# Patient Record
Sex: Female | Born: 1985 | Race: Black or African American | Hispanic: No | Marital: Single | Smoking: Never smoker
Health system: Southern US, Community
[De-identification: ages and names within clinical notes are randomized; demographics above are authoritative.]

## PROBLEM LIST (undated history)

## (undated) DIAGNOSIS — I10 Essential (primary) hypertension: Secondary | ICD-10-CM

---

## 2009-12-20 ENCOUNTER — Emergency Department (HOSPITAL_BASED_OUTPATIENT_CLINIC_OR_DEPARTMENT_OTHER): Admission: EM | Admit: 2009-12-20 | Discharge: 2009-12-20 | Payer: Self-pay | Admitting: Emergency Medicine

## 2010-10-11 ENCOUNTER — Emergency Department (HOSPITAL_BASED_OUTPATIENT_CLINIC_OR_DEPARTMENT_OTHER): Admission: EM | Admit: 2010-10-11 | Discharge: 2010-10-11 | Payer: Self-pay | Admitting: Emergency Medicine

## 2011-02-06 LAB — URINALYSIS, ROUTINE W REFLEX MICROSCOPIC
Bilirubin Urine: NEGATIVE
Glucose, UA: NEGATIVE mg/dL
Ketones, ur: NEGATIVE mg/dL
Specific Gravity, Urine: 1.026 (ref 1.005–1.030)
pH: 7.5 (ref 5.0–8.0)

## 2011-02-06 LAB — WET PREP, GENITAL: Trich, Wet Prep: NONE SEEN

## 2011-02-06 LAB — HCG, QUANTITATIVE, PREGNANCY: hCG, Beta Chain, Quant, S: 2656 m[IU]/mL — ABNORMAL HIGH (ref <5)

## 2011-02-06 LAB — DIFFERENTIAL
Basophils Absolute: 0.1 10*3/uL (ref 0.0–0.1)
Eosinophils Absolute: 0.1 10*3/uL (ref 0.0–0.7)
Eosinophils Relative: 1 % (ref 0–5)
Lymphs Abs: 2.1 10*3/uL (ref 0.7–4.0)
Neutrophils Relative %: 68 % (ref 43–77)

## 2011-02-06 LAB — ABO/RH: ABO/RH(D): O POS

## 2011-02-06 LAB — CBC
HCT: 41.3 % (ref 36.0–46.0)
MCV: 86.9 fL (ref 78.0–100.0)
Platelets: 242 10*3/uL (ref 150–400)
RDW: 13.7 % (ref 11.5–15.5)
WBC: 8.3 10*3/uL (ref 4.0–10.5)

## 2011-02-06 LAB — GC/CHLAMYDIA PROBE AMP, GENITAL: Chlamydia, DNA Probe: NEGATIVE

## 2011-02-28 ENCOUNTER — Emergency Department (HOSPITAL_BASED_OUTPATIENT_CLINIC_OR_DEPARTMENT_OTHER)
Admission: EM | Admit: 2011-02-28 | Discharge: 2011-02-28 | Disposition: A | Discharge status: Home / Self Care | Payer: Self-pay | Attending: Emergency Medicine | Admitting: Emergency Medicine

## 2011-02-28 DIAGNOSIS — J329 Chronic sinusitis, unspecified: Secondary | ICD-10-CM | POA: Insufficient documentation

## 2011-02-28 LAB — URINALYSIS, ROUTINE W REFLEX MICROSCOPIC
Bilirubin Urine: NEGATIVE
Glucose, UA: NEGATIVE mg/dL
Ketones, ur: NEGATIVE mg/dL
Protein, ur: NEGATIVE mg/dL
Urobilinogen, UA: 0.2 mg/dL (ref 0.0–1.0)

## 2011-02-28 LAB — URINE MICROSCOPIC-ADD ON

## 2012-02-20 ENCOUNTER — Encounter (HOSPITAL_BASED_OUTPATIENT_CLINIC_OR_DEPARTMENT_OTHER): Payer: Self-pay | Admitting: *Deleted

## 2012-02-20 ENCOUNTER — Emergency Department (HOSPITAL_BASED_OUTPATIENT_CLINIC_OR_DEPARTMENT_OTHER)
Admission: EM | Admit: 2012-02-20 | Discharge: 2012-02-20 | Disposition: A | Discharge status: Home / Self Care | Payer: Self-pay | Attending: Emergency Medicine | Admitting: Emergency Medicine

## 2012-02-20 DIAGNOSIS — B349 Viral infection, unspecified: Secondary | ICD-10-CM

## 2012-02-20 DIAGNOSIS — I10 Essential (primary) hypertension: Secondary | ICD-10-CM | POA: Insufficient documentation

## 2012-02-20 DIAGNOSIS — R1110 Vomiting, unspecified: Secondary | ICD-10-CM | POA: Insufficient documentation

## 2012-02-20 DIAGNOSIS — B9789 Other viral agents as the cause of diseases classified elsewhere: Secondary | ICD-10-CM | POA: Insufficient documentation

## 2012-02-20 DIAGNOSIS — R109 Unspecified abdominal pain: Secondary | ICD-10-CM | POA: Insufficient documentation

## 2012-02-20 DIAGNOSIS — R509 Fever, unspecified: Secondary | ICD-10-CM | POA: Insufficient documentation

## 2012-02-20 HISTORY — DX: Essential (primary) hypertension: I10

## 2012-02-20 LAB — URINALYSIS, ROUTINE W REFLEX MICROSCOPIC
Leukocytes, UA: NEGATIVE
Nitrite: NEGATIVE
Specific Gravity, Urine: 1.035 — ABNORMAL HIGH (ref 1.005–1.030)
Urobilinogen, UA: 1 mg/dL (ref 0.0–1.0)
pH: 6 (ref 5.0–8.0)

## 2012-02-20 LAB — URINE MICROSCOPIC-ADD ON

## 2012-02-20 LAB — PREGNANCY, URINE: Preg Test, Ur: NEGATIVE

## 2012-02-20 LAB — RAPID STREP SCREEN (MED CTR MEBANE ONLY): Streptococcus, Group A Screen (Direct): NEGATIVE

## 2012-02-20 MED ORDER — PROMETHAZINE HCL 25 MG PO TABS: 25.0000 mg | ORAL_TABLET | Freq: Four times a day (QID) | ORAL | Status: DC | PRN

## 2012-02-20 MED ORDER — ONDANSETRON 8 MG PO TBDP
8.0000 mg | ORAL_TABLET | Freq: Once | ORAL | Status: AC
  Administered 2012-02-20: 8 mg via ORAL
  Filled 2012-02-20: qty 1

## 2012-02-20 MED ORDER — HYOSCYAMINE SULFATE 0.125 MG PO TABS
0.2500 mg | ORAL_TABLET | Freq: Once | ORAL | Status: AC
  Administered 2012-02-20: 0.25 mg via ORAL
  Filled 2012-02-20 (×2): qty 1

## 2012-02-20 MED ORDER — HYDROCODONE-ACETAMINOPHEN 5-500 MG PO TABS: 1.0000 | ORAL_TABLET | Freq: Four times a day (QID) | ORAL | Status: AC | PRN

## 2012-02-20 MED ORDER — OXYCODONE-ACETAMINOPHEN 5-325 MG PO TABS
2.0000 | ORAL_TABLET | Freq: Once | ORAL | Status: AC
  Administered 2012-02-20: 2 via ORAL
  Filled 2012-02-20: qty 2

## 2012-02-20 NOTE — Discharge Instructions (Signed)
Viral Gastroenteritis Viral gastroenteritis is also known as stomach flu. This condition affects the stomach and intestinal tract. It can cause sudden diarrhea and vomiting. The illness typically lasts 3 to 8 days. Most people develop an immune response that eventually gets rid of the virus. While this natural response develops, the virus can make you quite ill. CAUSES  Many different viruses can cause gastroenteritis, such as rotavirus or noroviruses. You can catch one of these viruses by consuming contaminated food or water. You may also catch a virus by sharing utensils or other personal items with an infected person or by touching a contaminated surface. SYMPTOMS  The most common symptoms are diarrhea and vomiting. These problems can cause a severe loss of body fluids (dehydration) and a body salt (electrolyte) imbalance. Other symptoms may include:  Fever.   Headache.   Fatigue.   Abdominal pain.  DIAGNOSIS  Your caregiver can usually diagnose viral gastroenteritis based on your symptoms and a physical exam. A stool sample may also be taken to test for the presence of viruses or other infections. TREATMENT  This illness typically goes away on its own. Treatments are aimed at rehydration. The most serious cases of viral gastroenteritis involve vomiting so severely that you are not able to keep fluids down. In these cases, fluids must be given through an intravenous line (IV). HOME CARE INSTRUCTIONS   Drink enough fluids to keep your urine clear or pale yellow. Drink small amounts of fluids frequently and increase the amounts as tolerated.   Ask your caregiver for specific rehydration instructions.   Avoid:   Foods high in sugar.   Alcohol.   Carbonated drinks.   Tobacco.   Juice.   Caffeine drinks.   Extremely hot or cold fluids.   Fatty, greasy foods.   Too much intake of anything at one time.   Dairy products until 24 to 48 hours after diarrhea stops.   You may  consume probiotics. Probiotics are active cultures of beneficial bacteria. They may lessen the amount and number of diarrheal stools in adults. Probiotics can be found in yogurt with active cultures and in supplements.   Wash your hands well to avoid spreading the virus.   Only take over-the-counter or prescription medicines for pain, discomfort, or fever as directed by your caregiver. Do not give aspirin to children. Antidiarrheal medicines are not recommended.   Ask your caregiver if you should continue to take your regular prescribed and over-the-counter medicines.   Keep all follow-up appointments as directed by your caregiver.  SEEK IMMEDIATE MEDICAL CARE IF:   You are unable to keep fluids down.   You do not urinate at least once every 6 to 8 hours.   You develop shortness of breath.   You notice blood in your stool or vomit. This may look like coffee grounds.   You have abdominal pain that increases or is concentrated in one small area (localized).   You have persistent vomiting or diarrhea.   You have a fever.   The patient is a child younger than 3 months, and he or she has a fever.   The patient is a child older than 3 months, and he or she has a fever and persistent symptoms.   The patient is a child older than 3 months, and he or she has a fever and symptoms suddenly get worse.   The patient is a baby, and he or she has no tears when crying.  MAKE SURE YOU:     Understand these instructions.   Will watch your condition.   Will get help right away if you are not doing well or get worse.  Document Released: 11/03/2005 Document Revised: 10/23/2011 Document Reviewed: 08/20/2011 [employer] Patient Information 2012 [employer]. 

## 2012-02-20 NOTE — ED Notes (Signed)
Attempted to discharge pt. Pt sts she has to call her ride. Pt given telephone to do so. Pt advised to let nurse know when driver arrives to pick her up due to pt having pain medication.

## 2012-02-20 NOTE — ED Provider Notes (Signed)
History     CSN: [id number]  Arrival date & time 02/20/12  0804   First MD Initiated Contact with Patient 02/20/12 [phone]      Chief Complaint  Patient presents with  . Emesis  . Fever    (Consider location/radiation/quality/duration/timing/severity/associated sxs/prior treatment) Patient is a 26 y.o. female presenting with vomiting. The history is provided by the patient. No language interpreter was used.  Emesis  This is a new problem. The current episode started 2 days ago. The problem occurs 2 to 4 times per day. The problem has been gradually worsening. The emesis has an appearance of stomach contents. The maximum temperature recorded prior to her arrival was 100 to 100.9 F. The fever has been present for 1 to 2 days. Associated symptoms include abdominal pain and chills. Pertinent negatives include no arthralgias, no cough, no fever, no headaches and no myalgias. Risk factors include ill contacts.    Past Medical History  Diagnosis Date  . Hypertension     History reviewed. No pertinent past surgical history.  No family history on file.  History  Substance Use Topics  . Smoking status: Never Smoker   . Smokeless tobacco: Not on file  . Alcohol Use: Yes     occassionally    OB History    Grav Para Term Preterm Abortions TAB SAB Ect Mult Living                  Review of Systems  Constitutional: Positive for chills. Negative for fever, activity change, appetite change and fatigue.  HENT: Positive for congestion, sore throat and rhinorrhea. Negative for neck pain and neck stiffness.   Respiratory: Negative for cough and shortness of breath.   Cardiovascular: Negative for chest pain and palpitations.  Gastrointestinal: Positive for nausea, vomiting and abdominal pain.  Genitourinary: Negative for dysuria, urgency, frequency and flank pain.  Musculoskeletal: Negative for myalgias, back pain and arthralgias.  Neurological: Negative for dizziness, weakness,  light-headedness, numbness and headaches.  All other systems reviewed and are negative.    Allergies  Review of patient's allergies indicates no known allergies.  Home Medications   Current Outpatient Rx  Name Route Sig Dispense Refill  . NORGESTREL-ETHINYL ESTRADIOL 0.3-30 MG-MCG PO TABS Oral Take 1 tablet by mouth daily.    Marland Kitchen HYDROCODONE-ACETAMINOPHEN 5-500 MG PO TABS Oral Take 1-2 tablets by mouth every 6 (six) hours as needed for pain. 10 tablet 0  . PROMETHAZINE HCL 25 MG PO TABS Oral Take 1 tablet (25 mg total) by mouth every 6 (six) hours as needed for nausea. 20 tablet 0    BP 123/95  Pulse 97  Temp(Src) 98.2 F (36.8 C) (Oral)  Resp 20  Ht 5\' 1"  (1.549 m)  Wt 145 lb (65.772 kg)  BMI 27.40 kg/m2  SpO2 100%  LMP 02/10/2012  Physical Exam  Nursing note and vitals reviewed. Constitutional: She is oriented to person, place, and time. She appears well-developed and well-nourished. No distress.  HENT:  Head: Normocephalic and atraumatic.  Mouth/Throat: Oropharyngeal exudate present.       Erythematous oropharynx  Eyes: Conjunctivae and EOM are normal. Pupils are equal, round, and reactive to light.  Neck: Normal range of motion. Neck supple.  Cardiovascular: Normal rate, normal heart sounds and intact distal pulses.  Exam reveals no gallop and no friction rub.   No murmur heard. Pulmonary/Chest: Effort normal and breath sounds normal. No respiratory distress.  Abdominal: Soft. Bowel sounds are normal. There is tenderness (diffuse).  There is no rebound and no guarding.  Musculoskeletal: Normal range of motion. She exhibits no tenderness.  Neurological: She is alert and oriented to person, place, and time. No cranial nerve deficit.  Skin: Skin is warm and dry. No rash noted.    ED Course  Procedures (including critical care time)  Labs Reviewed  URINALYSIS, ROUTINE W REFLEX MICROSCOPIC - Abnormal; Notable for the following:    Color, Urine AMBER (*) BIOCHEMICALS MAY  BE AFFECTED BY COLOR   Specific Gravity, Urine 1.035 (*)    Bilirubin Urine SMALL (*)    Ketones, ur 15 (*)    Protein, ur 30 (*)    All other components within normal limits  URINE MICROSCOPIC-ADD ON - Abnormal; Notable for the following:    Bacteria, UA MANY (*)    All other components within normal limits  PREGNANCY, URINE  RAPID STREP SCREEN   No results found.   1. Viral syndrome       MDM  viral syndrome with URI and gastritis type symptoms. I encouraged or over-the-counter medications for the viral URI portion of her syndrome. I provided Zofran and pain medication numerous department. Urinalysis and rapid strep negative. We prescribed Phenergan Vicodin for home use. Instructed to followup with her primary care physician and encouraged aggressive oral hydration        Dayton Bailiff, MD 02/20/12 [phone]

## 2012-02-20 NOTE — ED Notes (Signed)
Patient states she developed a fever, sore throat, sinus congestion and bodyaches 2 days ago.  Yesterday she developed nausea and vomting.  Fever has continued with vomiting and abdominal cramping.

## 2012-09-09 ENCOUNTER — Emergency Department (HOSPITAL_BASED_OUTPATIENT_CLINIC_OR_DEPARTMENT_OTHER)
Admission: EM | Admit: 2012-09-09 | Discharge: 2012-09-09 | Disposition: A | Discharge status: Home / Self Care | Payer: Self-pay | Attending: Emergency Medicine | Admitting: Emergency Medicine

## 2012-09-09 ENCOUNTER — Emergency Department (HOSPITAL_BASED_OUTPATIENT_CLINIC_OR_DEPARTMENT_OTHER): Payer: Self-pay

## 2012-09-09 ENCOUNTER — Encounter (HOSPITAL_BASED_OUTPATIENT_CLINIC_OR_DEPARTMENT_OTHER): Payer: Self-pay | Admitting: *Deleted

## 2012-09-09 DIAGNOSIS — S060X9A Concussion with loss of consciousness of unspecified duration, initial encounter: Secondary | ICD-10-CM

## 2012-09-09 DIAGNOSIS — R42 Dizziness and giddiness: Secondary | ICD-10-CM | POA: Insufficient documentation

## 2012-09-09 DIAGNOSIS — R51 Headache: Secondary | ICD-10-CM | POA: Insufficient documentation

## 2012-09-09 DIAGNOSIS — I10 Essential (primary) hypertension: Secondary | ICD-10-CM | POA: Insufficient documentation

## 2012-09-09 DIAGNOSIS — S058X9A Other injuries of unspecified eye and orbit, initial encounter: Secondary | ICD-10-CM

## 2012-09-09 DIAGNOSIS — S060X0A Concussion without loss of consciousness, initial encounter: Secondary | ICD-10-CM | POA: Insufficient documentation

## 2012-09-09 DIAGNOSIS — Z79899 Other long term (current) drug therapy: Secondary | ICD-10-CM | POA: Insufficient documentation

## 2012-09-09 DIAGNOSIS — S0500XA Injury of conjunctiva and corneal abrasion without foreign body, unspecified eye, initial encounter: Secondary | ICD-10-CM | POA: Insufficient documentation

## 2012-09-09 LAB — URINALYSIS, ROUTINE W REFLEX MICROSCOPIC
Glucose, UA: NEGATIVE mg/dL
Ketones, ur: >80 mg/dL — AB
Nitrite: NEGATIVE
Specific Gravity, Urine: 1.034 — ABNORMAL HIGH (ref 1.005–1.030)
pH: 6 (ref 5.0–8.0)

## 2012-09-09 LAB — URINE MICROSCOPIC-ADD ON
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
## 2012-09-10 LAB — URINE CULTURE
Colony Count: NO GROWTH
Culture: NO GROWTH

## 2013-01-19 ENCOUNTER — Emergency Department (HOSPITAL_BASED_OUTPATIENT_CLINIC_OR_DEPARTMENT_OTHER): Payer: Self-pay

## 2013-01-19 ENCOUNTER — Encounter (HOSPITAL_BASED_OUTPATIENT_CLINIC_OR_DEPARTMENT_OTHER): Payer: Self-pay | Admitting: *Deleted

## 2013-01-19 ENCOUNTER — Emergency Department (HOSPITAL_BASED_OUTPATIENT_CLINIC_OR_DEPARTMENT_OTHER)
Admission: EM | Admit: 2013-01-19 | Discharge: 2013-01-19 | Disposition: A | Discharge status: Home / Self Care | Payer: Self-pay | Attending: Emergency Medicine | Admitting: Emergency Medicine

## 2013-01-19 DIAGNOSIS — O210 Mild hyperemesis gravidarum: Secondary | ICD-10-CM | POA: Insufficient documentation

## 2013-01-19 DIAGNOSIS — O209 Hemorrhage in early pregnancy, unspecified: Secondary | ICD-10-CM | POA: Insufficient documentation

## 2013-01-19 DIAGNOSIS — N949 Unspecified condition associated with female genital organs and menstrual cycle: Secondary | ICD-10-CM | POA: Insufficient documentation

## 2013-01-19 DIAGNOSIS — O4591 Premature separation of placenta, unspecified, first trimester: Secondary | ICD-10-CM

## 2013-01-19 DIAGNOSIS — R109 Unspecified abdominal pain: Secondary | ICD-10-CM | POA: Insufficient documentation

## 2013-01-19 DIAGNOSIS — O169 Unspecified maternal hypertension, unspecified trimester: Secondary | ICD-10-CM | POA: Insufficient documentation

## 2013-01-19 DIAGNOSIS — O9989 Other specified diseases and conditions complicating pregnancy, childbirth and the puerperium: Secondary | ICD-10-CM | POA: Insufficient documentation

## 2013-01-19 DIAGNOSIS — O368990 Maternal care for other specified fetal problems, unspecified trimester, not applicable or unspecified: Secondary | ICD-10-CM | POA: Insufficient documentation

## 2013-01-19 LAB — WET PREP, GENITAL
Trich, Wet Prep: NONE SEEN
WBC, Wet Prep HPF POC: NONE SEEN
Yeast Wet Prep HPF POC: NONE SEEN

## 2013-01-19 LAB — ABO/RH: ABO/RH(D): O POS

## 2013-01-19 LAB — HCG, QUANTITATIVE, PREGNANCY: hCG, Beta Chain, Quant, S: 52896 m[IU]/mL — ABNORMAL HIGH (ref <5)

## 2013-01-19 NOTE — ED Provider Notes (Signed)
History     CSN: [id number]  Arrival date & time 01/19/13  1417   First MD Initiated Contact with Patient 01/19/13 1504      Chief Complaint  Patient presents with  . Vaginal Bleeding    (Consider location/radiation/quality/duration/timing/severity/associated sxs/prior treatment) Patient is a 27 y.o. female presenting with vaginal bleeding. The history is provided by the patient. No language interpreter was used.  Vaginal Bleeding This is a new (Last seen by PCP 01/04/13 to take pregnancy test.  Was told she was almost 50mo along.) problem. The current episode started today (Denies trauma, recent illness or increased stress or changes in her life.). Associated symptoms include abdominal pain, nausea and vomiting. Pertinent negatives include no chest pain, chills, fatigue, fever, headaches or urinary symptoms. Associated symptoms comments: Some abdominal cramping. Pt has been nauseous since she found out she was pregnant.  Able to keep down fluids and crackers.. Nothing aggravates the symptoms. She has tried nothing for the symptoms.    Past Medical History  Diagnosis Date  . Hypertension     History reviewed. No pertinent past surgical history.  No family history on file.  History  Substance Use Topics  . Smoking status: Never Smoker   . Smokeless tobacco: Never Used  . Alcohol Use: Yes     Comment: occassionally    OB History   Grav Para Term Preterm Abortions TAB SAB Ect Mult Living                  Review of Systems  Constitutional: Negative for fever, chills and fatigue.  Respiratory: Negative for shortness of breath.   Cardiovascular: Negative for chest pain.  Gastrointestinal: Positive for nausea, vomiting and abdominal pain.  Genitourinary: Positive for vaginal bleeding and pelvic pain. Negative for dysuria and flank pain.  Neurological: Negative for headaches.  Psychiatric/Behavioral: Negative.   All other systems reviewed and are negative.    Allergies   Review of patient's allergies indicates no known allergies.  Home Medications   Current Outpatient Rx  Name  Route  Sig  Dispense  Refill  . Prenatal Vit-Fe Fumarate-FA (PRENATAL MULTIVITAMIN) TABS   Oral   Take 1 tablet by mouth daily at 12 noon.         . norgestrel-ethinyl estradiol (LO/OVRAL,CRYSELLE) 0.3-30 MG-MCG tablet   Oral   Take 1 tablet by mouth daily.         Marland Kitchen EXPIRED: promethazine (PHENERGAN) 25 MG tablet   Oral   Take 1 tablet (25 mg total) by mouth every 6 (six) hours as needed for nausea.   20 tablet   0     BP 124/75  Pulse 110  Temp(Src) 99 F (37.2 C) (Oral)  Resp 20  Ht 5' (1.524 m)  Wt 112 lb (50.803 kg)  BMI 21.87 kg/m2  SpO2 100%  LMP 11/15/2012  Physical Exam  Nursing note and vitals reviewed. Constitutional: She is oriented to person, place, and time. She appears well-developed and well-nourished.  HENT:  Head: Normocephalic and atraumatic.  Eyes: EOM are normal. Pupils are equal, round, and reactive to light.  Cardiovascular: Normal rate, regular rhythm and normal heart sounds.   Pulmonary/Chest: Effort normal and breath sounds normal. No respiratory distress.  Abdominal: Soft. Bowel sounds are normal. There is tenderness. There is no guarding.  Moderately tender throughout abdomen.  Genitourinary:  External genitalia: nl. No lesion, masses, or rashes.  Vaginal exam: Mild vaginal bleeding, cervix dilated 1cm, visible clot partially covering cervical os.  Mild-mod tenderness during bimanual exam.  No masses felt.   Neurological: She is alert and oriented to person, place, and time.  Skin: Skin is warm and dry.    ED Course  Procedures (including critical care time)  Labs Reviewed - No data to display No results found.   1. Vaginal bleeding before [redacted] weeks gestation   2. Subchorionic hemorrhage of placenta, first trimester       MDM  Pt was seen for vaginal bleeding during pregnancy.  U/S confirmed fetus is 9wks 5 days  old, with normal heart beat.  Dx: Subchorionic hemorrhage of placenta.  Advised to go to [facility] MAU if increased bleeding, pain, or weakness.  F/u in 48hrs at [facility].  Pelvic rest until bleeding stops.        Junius Finner, PA-C 01/19/13 1811
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
## 2013-01-19 NOTE — ED Notes (Signed)
Patient unable to void at this time for urine sample.  Pelvic cart set up at bedside.

## 2013-01-19 NOTE — ED Notes (Signed)
Lab called to report hCg is >10,000 but there is a problem with machine for further dilution-EDPA notified

## 2013-01-20 LAB — GC/CHLAMYDIA PROBE AMP
CT Probe RNA: NEGATIVE
GC Probe RNA: NEGATIVE

## 2013-01-21 NOTE — ED Provider Notes (Signed)
Medical screening examination/treatment/procedure(s) were performed by non-physician practitioner and as supervising physician I was immediately available for consultation/collaboration.   Shelda Jakes, MD 01/21/13 [phone]

## 2013-05-13 LAB — OB RESULTS CONSOLE HIV ANTIBODY (ROUTINE TESTING): HIV: NONREACTIVE

## 2013-05-13 LAB — OB RESULTS CONSOLE RPR: RPR: NONREACTIVE

## 2013-05-13 LAB — OB RESULTS CONSOLE RUBELLA ANTIBODY, IGM: Rubella: IMMUNE

## 2013-05-13 LAB — OB RESULTS CONSOLE GC/CHLAMYDIA
Chlamydia: NEGATIVE
Gonorrhea: NEGATIVE

## 2013-08-16 ENCOUNTER — Inpatient Hospital Stay (HOSPITAL_COMMUNITY)
Admission: AD | Admit: 2013-08-16 | Discharge: 2013-08-18 | DRG: 767 | Disposition: A | Discharge status: Home / Self Care | Payer: Medicaid Other | Source: Ambulatory Visit | Attending: Obstetrics and Gynecology | Admitting: Obstetrics and Gynecology

## 2013-08-16 ENCOUNTER — Encounter (HOSPITAL_COMMUNITY): Payer: Self-pay | Admitting: *Deleted

## 2013-08-16 DIAGNOSIS — Z9851 Tubal ligation status: Secondary | ICD-10-CM

## 2013-08-16 DIAGNOSIS — Z302 Encounter for sterilization: Secondary | ICD-10-CM

## 2013-08-16 LAB — CBC
HCT: 40 % (ref 36.0–46.0)
Hemoglobin: 13.6 g/dL (ref 12.0–15.0)
MCH: 29.1 pg (ref 26.0–34.0)
MCHC: 34 g/dL (ref 30.0–36.0)

## 2013-08-16 MED ORDER — SIMETHICONE 80 MG PO CHEW: 80.0000 mg | CHEWABLE_TABLET | ORAL | Status: DC | PRN

## 2013-08-16 MED ORDER — PENICILLIN G POTASSIUM 5000000 UNITS IJ SOLR
2.5000 10*6.[IU] | INTRAVENOUS | Status: DC
  Administered 2013-08-16: 2.5 10*6.[IU] via INTRAVENOUS
  Filled 2013-08-16 (×4): qty 2.5

## 2013-08-16 MED ORDER — ZOLPIDEM TARTRATE 5 MG PO TABS: 5.0000 mg | ORAL_TABLET | Freq: Every evening | ORAL | Status: DC | PRN

## 2013-08-16 MED ORDER — OXYTOCIN 40 UNITS IN LACTATED RINGERS INFUSION - SIMPLE MED
62.5000 mL/h | INTRAVENOUS | Status: DC
  Filled 2013-08-16: qty 1000

## 2013-08-16 MED ORDER — OXYCODONE-ACETAMINOPHEN 5-325 MG PO TABS: 1.0000 | ORAL_TABLET | ORAL | Status: DC | PRN

## 2013-08-16 MED ORDER — TETANUS-DIPHTH-ACELL PERTUSSIS 5-2.5-18.5 LF-MCG/0.5 IM SUSP: 0.5000 mL | Freq: Once | INTRAMUSCULAR | Status: DC

## 2013-08-16 MED ORDER — OXYTOCIN 40 UNITS IN LACTATED RINGERS INFUSION - SIMPLE MED
1.0000 m[IU]/min | INTRAVENOUS | Status: DC
  Administered 2013-08-16: 1 m[IU]/min via INTRAVENOUS

## 2013-08-16 MED ORDER — DIPHENHYDRAMINE HCL 25 MG PO CAPS: 25.0000 mg | ORAL_CAPSULE | Freq: Four times a day (QID) | ORAL | Status: DC | PRN

## 2013-08-16 MED ORDER — LACTATED RINGERS IV SOLN
INTRAVENOUS | Status: AC
  Administered 2013-08-17: 16:00:00 via INTRAVENOUS

## 2013-08-16 MED ORDER — LACTATED RINGERS IV SOLN: 500.0000 mL | INTRAVENOUS | Status: DC | PRN

## 2013-08-16 MED ORDER — OXYTOCIN 40 UNITS IN LACTATED RINGERS INFUSION - SIMPLE MED: 62.5000 mL/h | INTRAVENOUS | Status: AC | PRN

## 2013-08-16 MED ORDER — LIDOCAINE HCL (PF) 1 % IJ SOLN
30.0000 mL | INTRAMUSCULAR | Status: DC | PRN
  Filled 2013-08-16: qty 30

## 2013-08-16 MED ORDER — ONDANSETRON HCL 4 MG/2ML IJ SOLN: 4.0000 mg | INTRAMUSCULAR | Status: DC | PRN

## 2013-08-16 MED ORDER — OXYTOCIN BOLUS FROM INFUSION: 500.0000 mL | INTRAVENOUS | Status: DC

## 2013-08-16 MED ORDER — PROMETHAZINE HCL 25 MG PO TABS: 25.0000 mg | ORAL_TABLET | Freq: Four times a day (QID) | ORAL | Status: DC | PRN

## 2013-08-16 MED ORDER — BUTORPHANOL TARTRATE 1 MG/ML IJ SOLN
1.0000 mg | Freq: Once | INTRAMUSCULAR | Status: AC
  Administered 2013-08-16: 1 mg via INTRAVENOUS
  Filled 2013-08-16: qty 1

## 2013-08-16 MED ORDER — BENZOCAINE-MENTHOL 20-0.5 % EX AERO: 1.0000 "application " | INHALATION_SPRAY | CUTANEOUS | Status: DC | PRN

## 2013-08-16 MED ORDER — ONDANSETRON HCL 4 MG/2ML IJ SOLN: 4.0000 mg | Freq: Four times a day (QID) | INTRAMUSCULAR | Status: DC | PRN

## 2013-08-16 MED ORDER — ONDANSETRON HCL 4 MG PO TABS
4.0000 mg | ORAL_TABLET | ORAL | Status: DC | PRN
  Administered 2013-08-17 – 2013-08-18 (×2): 4 mg via ORAL
  Filled 2013-08-16 (×2): qty 1

## 2013-08-16 MED ORDER — LACTATED RINGERS IV SOLN
INTRAVENOUS | Status: DC
  Administered 2013-08-16: 10:00:00 via INTRAVENOUS

## 2013-08-16 MED ORDER — IBUPROFEN 600 MG PO TABS
600.0000 mg | ORAL_TABLET | Freq: Four times a day (QID) | ORAL | Status: DC | PRN
  Administered 2013-08-16: 600 mg via ORAL
  Filled 2013-08-16: qty 1

## 2013-08-16 MED ORDER — WITCH HAZEL-GLYCERIN EX PADS: 1.0000 "application " | MEDICATED_PAD | CUTANEOUS | Status: DC | PRN

## 2013-08-16 MED ORDER — MEASLES, MUMPS & RUBELLA VAC ~~LOC~~ INJ: 0.5000 mL | INJECTION | Freq: Once | SUBCUTANEOUS | Status: DC

## 2013-08-16 MED ORDER — OXYCODONE-ACETAMINOPHEN 5-325 MG PO TABS
1.0000 | ORAL_TABLET | ORAL | Status: DC | PRN
  Administered 2013-08-17 – 2013-08-18 (×2): 1 via ORAL
  Filled 2013-08-16: qty 2
  Filled 2013-08-16 (×2): qty 1

## 2013-08-16 MED ORDER — DIBUCAINE 1 % RE OINT: 1.0000 "application " | TOPICAL_OINTMENT | RECTAL | Status: DC | PRN

## 2013-08-16 MED ORDER — LANOLIN HYDROUS EX OINT: TOPICAL_OINTMENT | CUTANEOUS | Status: DC | PRN

## 2013-08-16 MED ORDER — TERBUTALINE SULFATE 1 MG/ML IJ SOLN: 0.2500 mg | Freq: Once | INTRAMUSCULAR | Status: DC | PRN

## 2013-08-16 MED ORDER — PRENATAL MULTIVITAMIN CH: 1.0000 | ORAL_TABLET | Freq: Every day | ORAL | Status: DC

## 2013-08-16 MED ORDER — HEPARIN SODIUM (PORCINE) 5000 UNIT/ML IJ SOLN: 5000.0000 [IU] | INTRAMUSCULAR | Status: DC

## 2013-08-16 MED ORDER — ACETAMINOPHEN 325 MG PO TABS: 650.0000 mg | ORAL_TABLET | ORAL | Status: DC | PRN

## 2013-08-16 MED ORDER — IBUPROFEN 600 MG PO TABS
600.0000 mg | ORAL_TABLET | Freq: Four times a day (QID) | ORAL | Status: DC
  Administered 2013-08-16 – 2013-08-18 (×5): 600 mg via ORAL
  Filled 2013-08-16 (×5): qty 1

## 2013-08-16 MED ORDER — PENICILLIN G POTASSIUM 5000000 UNITS IJ SOLR
5.0000 10*6.[IU] | Freq: Once | INTRAVENOUS | Status: AC
  Administered 2013-08-16: 5 10*6.[IU] via INTRAVENOUS
  Filled 2013-08-16: qty 5

## 2013-08-16 MED ORDER — SENNOSIDES-DOCUSATE SODIUM 8.6-50 MG PO TABS
2.0000 | ORAL_TABLET | ORAL | Status: DC
  Administered 2013-08-16 – 2013-08-17 (×2): 2 via ORAL

## 2013-08-16 MED ORDER — CITRIC ACID-SODIUM CITRATE 334-500 MG/5ML PO SOLN: 30.0000 mL | ORAL | Status: DC | PRN
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
## 2013-08-16 NOTE — MAU Note (Signed)
Patient states she started leaking clear fluid at 1800 on 9-29. Denies any contractions and reports good fetal movement.

## 2013-08-16 NOTE — H&P (Signed)
NAMEMCKENZY, SALAZAR       ACCOUNT NO.:  [id number]  MEDICAL RECORD NO.:  [id number]  LOCATION:  9166                          FACILITY:  [facility]  PHYSICIAN:  Malachi Pro. Ambrose Mantle, M.D. DATE OF BIRTH:  1986-07-23  DATE OF ADMISSION:  08/16/2013 DATE OF DISCHARGE:                             HISTORY & PHYSICAL   HISTORY OF PRESENT ILLNESS:  This is a 27 year old black female, para 2- 0-1-2, gravida 4, EDC August 22, 2013, who is admitted with premature rupture of the membranes.  The patient states the rupture of membranes occurred at 6:30 to 7 p.m. on August 15, 2013, but she did not seek medical care.  Blood group and type is O positive with a negative antibody, RPR is nonreactive.  Rubella is nonimmune.  Hepatitis B surface antigen is negative.  GC and Chlamydia are negative.  Urine culture is negative.  Group B strep culture is positive.  One-hour Glucola is 88.  Early ultrasound suggested marginal placenta previa. Followup ultrasound on June 29, 2013, showed resolution of the placenta previa.  The patient's blood pressure at her last prenatal visit was 140/86, prior prenatal blood pressures were all normal.  The patient underwent a nonstress test for size less than date, late in pregnancy.  The nonstress test was continued.  They were all normal at her prenatal visit on August 12, 2013.  The fundal height was 36 cm.  An ultrasound on July 27, 2013, done because the fundal height was lagging showed the estimated fetal weight was 5 pounds 4 ounces, average ultrasound age was 33 weeks and 3 days.  The estimated fetal weight was in the 14.1 percentile.  A biophysical profile at that time, was completely normal at 8/8.  PAST MEDICAL HISTORY:  Reveals a history of hypertension.  She did have a problem with a possible placenta previa early in pregnancy, but that resolved.  ALLERGIES:  She has had no known allergies.  No latex allergy.  No food allergies.  PAST  SURGICAL HISTORY:  She did have a termination of pregnancy on Apr 13, 2012.  She has never smoked.  Never drunk alcohol.  She has 12 years of education.  PHYSICAL EXAMINATION:  On admission; VITAL SIGNS:  Her blood pressure is 127/87, pulse is 80, respirations 18,  temperature 98.6. HEART:  Normal size and sounds.  No murmurs. LUNGS:  Clear to auscultation. ABDOMEN:  Fundal height at her last prenatal visit was 36 cm.  Fetal heart tones normal.  The patient is on Pitocin and has contractions every 4 minutes. Per the nurse's exam, the cervix was 3 cm, 50%, vertex at a -2.  Rupture of membranes was confirmed in the MAU and there is meconium-stained fluid per the nurse's exam.  The patient reports that the fluid was initially clear.  ADMITTING IMPRESSION:  Intrauterine pregnancy at 39+ weeks, premature rupture of the membranes positive group B strep.  The patient is placed on penicillin and has been placed on Pitocin to augment contractions.     Malachi Pro. Ambrose Mantle, M.D.     TFH/MEDQ  D:  08/16/2013  T:  08/16/2013  Job:  [id number]

## 2013-08-16 NOTE — MAU Note (Addendum)
States ruptured membranes at 1830 and noted clear fluid. States she waited to come in because she was not having contractions. Meconium stained fluid noted on arrival to MAU. States was having weekly NST's because the "placenta tore away."

## 2013-08-16 NOTE — Progress Notes (Signed)
Delivery of live viable female by Dr Ambrose Mantle. APGARS 9,9

## 2013-08-17 ENCOUNTER — Encounter (HOSPITAL_COMMUNITY): Payer: Self-pay | Admitting: Anesthesiology

## 2013-08-17 ENCOUNTER — Inpatient Hospital Stay (HOSPITAL_COMMUNITY): Payer: Medicaid Other | Admitting: Anesthesiology

## 2013-08-17 ENCOUNTER — Encounter (HOSPITAL_COMMUNITY)
Admission: AD | Disposition: A | Discharge status: Home / Self Care | Payer: Self-pay | Source: Ambulatory Visit | Attending: Obstetrics and Gynecology

## 2013-08-17 HISTORY — PX: TUBAL LIGATION: SHX77

## 2013-08-17 LAB — SURGICAL PCR SCREEN
MRSA, PCR: NEGATIVE
Staphylococcus aureus: NEGATIVE

## 2013-08-17 LAB — CBC
HCT: 39.1 % (ref 36.0–46.0)
Hemoglobin: 13.1 g/dL (ref 12.0–15.0)
MCH: 28.6 pg (ref 26.0–34.0)
MCV: 85.4 fL (ref 78.0–100.0)
RBC: 4.58 MIL/uL (ref 3.87–5.11)
WBC: 11.6 10*3/uL — ABNORMAL HIGH (ref 4.0–10.5)

## 2013-08-17 SURGERY — LIGATION, FALLOPIAN TUBE, POSTPARTUM
Anesthesia: General | Site: Abdomen | Laterality: Bilateral | Wound class: Clean

## 2013-08-17 MED ORDER — KETOROLAC TROMETHAMINE 30 MG/ML IJ SOLN
INTRAMUSCULAR | Status: AC
  Filled 2013-08-17: qty 1

## 2013-08-17 MED ORDER — ONDANSETRON HCL 4 MG/2ML IJ SOLN: 4.0000 mg | Freq: Four times a day (QID) | INTRAMUSCULAR | Status: DC | PRN

## 2013-08-17 MED ORDER — ROCURONIUM BROMIDE 50 MG/5ML IV SOLN
INTRAVENOUS | Status: AC
  Filled 2013-08-17: qty 1

## 2013-08-17 MED ORDER — MIDAZOLAM HCL 2 MG/2ML IJ SOLN
INTRAMUSCULAR | Status: AC
  Filled 2013-08-17: qty 2

## 2013-08-17 MED ORDER — KETOROLAC TROMETHAMINE 30 MG/ML IJ SOLN
INTRAMUSCULAR | Status: AC
  Administered 2013-08-17: 30 mg via INTRAVENOUS
  Filled 2013-08-17: qty 1

## 2013-08-17 MED ORDER — HYDROMORPHONE HCL PF 1 MG/ML IJ SOLN
INTRAMUSCULAR | Status: AC
  Administered 2013-08-17: 0.5 mg via INTRAVENOUS
  Filled 2013-08-17: qty 1

## 2013-08-17 MED ORDER — LACTATED RINGERS IV SOLN: INTRAVENOUS | Status: DC

## 2013-08-17 MED ORDER — SUCCINYLCHOLINE CHLORIDE 20 MG/ML IJ SOLN
INTRAMUSCULAR | Status: DC | PRN
  Administered 2013-08-17: 120 mg via INTRAVENOUS

## 2013-08-17 MED ORDER — FENTANYL CITRATE 0.05 MG/ML IJ SOLN
INTRAMUSCULAR | Status: AC
  Filled 2013-08-17: qty 2

## 2013-08-17 MED ORDER — ONDANSETRON HCL 4 MG PO TABS: 4.0000 mg | ORAL_TABLET | Freq: Four times a day (QID) | ORAL | Status: DC | PRN

## 2013-08-17 MED ORDER — METOCLOPRAMIDE HCL 10 MG PO TABS
10.0000 mg | ORAL_TABLET | Freq: Once | ORAL | Status: AC
  Administered 2013-08-17: 10 mg via ORAL
  Filled 2013-08-17: qty 1

## 2013-08-17 MED ORDER — FENTANYL CITRATE 0.05 MG/ML IJ SOLN
INTRAMUSCULAR | Status: DC | PRN
  Administered 2013-08-17 (×4): 50 ug via INTRAVENOUS

## 2013-08-17 MED ORDER — LACTATED RINGERS IV SOLN
INTRAVENOUS | Status: DC
  Administered 2013-08-17: 20 mL via INTRAVENOUS

## 2013-08-17 MED ORDER — LIDOCAINE HCL (CARDIAC) 20 MG/ML IV SOLN
INTRAVENOUS | Status: AC
  Filled 2013-08-17: qty 5

## 2013-08-17 MED ORDER — PROPOFOL 10 MG/ML IV BOLUS
INTRAVENOUS | Status: DC | PRN
  Administered 2013-08-17: 200 mg via INTRAVENOUS

## 2013-08-17 MED ORDER — NEOSTIGMINE METHYLSULFATE 1 MG/ML IJ SOLN
INTRAMUSCULAR | Status: AC
  Filled 2013-08-17: qty 1

## 2013-08-17 MED ORDER — KETOROLAC TROMETHAMINE 30 MG/ML IJ SOLN
15.0000 mg | Freq: Once | INTRAMUSCULAR | Status: AC | PRN
  Administered 2013-08-17: 30 mg via INTRAVENOUS

## 2013-08-17 MED ORDER — GLYCOPYRROLATE 0.2 MG/ML IJ SOLN
INTRAMUSCULAR | Status: AC
  Filled 2013-08-17: qty 3

## 2013-08-17 MED ORDER — PROPOFOL 10 MG/ML IV EMUL
INTRAVENOUS | Status: AC
  Filled 2013-08-17: qty 20

## 2013-08-17 MED ORDER — 0.9 % SODIUM CHLORIDE (POUR BTL) OPTIME
TOPICAL | Status: DC | PRN
  Administered 2013-08-17: 1000 mL

## 2013-08-17 MED ORDER — ONDANSETRON HCL 4 MG/2ML IJ SOLN
INTRAMUSCULAR | Status: AC
  Filled 2013-08-17: qty 2

## 2013-08-17 MED ORDER — PROMETHAZINE HCL 25 MG/ML IJ SOLN: 6.2500 mg | INTRAMUSCULAR | Status: DC | PRN

## 2013-08-17 MED ORDER — IBUPROFEN 600 MG PO TABS: 600.0000 mg | ORAL_TABLET | Freq: Four times a day (QID) | ORAL | Status: DC | PRN

## 2013-08-17 MED ORDER — MIDAZOLAM HCL 2 MG/2ML IJ SOLN
INTRAMUSCULAR | Status: DC | PRN
  Administered 2013-08-17: 2 mg via INTRAVENOUS

## 2013-08-17 MED ORDER — MEPERIDINE HCL 25 MG/ML IJ SOLN: 6.2500 mg | INTRAMUSCULAR | Status: DC | PRN

## 2013-08-17 MED ORDER — FAMOTIDINE 20 MG PO TABS
40.0000 mg | ORAL_TABLET | Freq: Once | ORAL | Status: AC
  Administered 2013-08-17: 40 mg via ORAL
  Filled 2013-08-17: qty 2

## 2013-08-17 MED ORDER — LIDOCAINE HCL (CARDIAC) 20 MG/ML IV SOLN
INTRAVENOUS | Status: DC | PRN
  Administered 2013-08-17: 30 mg via INTRAVENOUS
  Administered 2013-08-17: 70 mg via INTRAVENOUS

## 2013-08-17 MED ORDER — OXYCODONE-ACETAMINOPHEN 5-325 MG PO TABS
1.0000 | ORAL_TABLET | Freq: Four times a day (QID) | ORAL | Status: DC | PRN
  Administered 2013-08-18: 1 via ORAL

## 2013-08-17 MED ORDER — SUCCINYLCHOLINE CHLORIDE 20 MG/ML IJ SOLN
INTRAMUSCULAR | Status: AC
  Filled 2013-08-17: qty 10

## 2013-08-17 MED ORDER — ONDANSETRON HCL 4 MG/2ML IJ SOLN
INTRAMUSCULAR | Status: DC | PRN
  Administered 2013-08-17: 4 mg via INTRAVENOUS

## 2013-08-17 MED ORDER — HYDROMORPHONE HCL PF 1 MG/ML IJ SOLN
0.2500 mg | INTRAMUSCULAR | Status: DC | PRN
Start: 2013-08-17 — End: 2013-08-17
  Administered 2013-08-17 (×4): 0.5 mg via INTRAVENOUS

## 2013-08-17 SURGICAL SUPPLY — 23 items
CLOTH BEACON ORANGE TIMEOUT ST (SAFETY) ×2 IMPLANT
CONTAINER PREFILL 10% NBF 15ML (MISCELLANEOUS) ×4 IMPLANT
DRSG COVADERM PLUS 2X2 (GAUZE/BANDAGES/DRESSINGS) ×2 IMPLANT
ELECT REM PT RETURN 9FT ADLT (ELECTROSURGICAL) ×2
ELECTRODE REM PT RTRN 9FT ADLT (ELECTROSURGICAL) ×1 IMPLANT
GLOVE BIO SURGEON STRL SZ7.5 (GLOVE) ×2 IMPLANT
GOWN PREVENTION PLUS LG XLONG (DISPOSABLE) ×2 IMPLANT
GOWN PREVENTION PLUS XLARGE (GOWN DISPOSABLE) ×2 IMPLANT
NS IRRIG 1000ML POUR BTL (IV SOLUTION) ×2 IMPLANT
PACK ABDOMINAL MINOR (CUSTOM PROCEDURE TRAY) ×2 IMPLANT
PENCIL BUTTON HOLSTER BLD 10FT (ELECTRODE) ×2 IMPLANT
SPONGE LAP 4X18 X RAY DECT (DISPOSABLE) ×2 IMPLANT
SUT PDS AB 1 CT1 36 (SUTURE) ×2 IMPLANT
SUT PLAIN 0 NONE (SUTURE) ×4 IMPLANT
SUT PLAIN 2 0 (SUTURE)
SUT PLAIN 3 0 X 1 18 (SUTURE) ×4 IMPLANT
SUT PLAIN ABS 2-0 54XMFL TIE (SUTURE) IMPLANT
SUT VIC AB 0 CT1 27 (SUTURE) ×2
SUT VIC AB 0 CT1 27XBRD ANBCTR (SUTURE) ×2 IMPLANT
SUT VIC AB 3-0 CTX 36 (SUTURE) ×2 IMPLANT
TOWEL OR 17X24 6PK STRL BLUE (TOWEL DISPOSABLE) ×4 IMPLANT
TRAY FOLEY CATH 14FR (SET/KITS/TRAYS/PACK) ×2 IMPLANT
WATER STERILE IRR 1000ML POUR (IV SOLUTION) ×2 IMPLANT

## 2013-08-17 NOTE — Transfer of Care (Signed)
Immediate Anesthesia Transfer of Care Note  Patient: [name]  Procedure(s) Performed: Procedure(s): POST PARTUM TUBAL LIGATION (Bilateral)  Patient Location: PACU  Anesthesia Type:General  Level of Consciousness: awake, oriented and patient cooperative  Airway & Oxygen Therapy: Patient Spontanous Breathing and Patient connected to nasal cannula oxygen  Post-op Assessment: Report given to PACU RN and Post -op Vital signs reviewed and stable  Post vital signs: Reviewed and stable  Complications: No apparent anesthesia complications

## 2013-08-17 NOTE — Anesthesia Postprocedure Evaluation (Signed)
Anesthesia Post Note  Patient: [name]  Procedure(s) Performed: Procedure(s) (LRB): POST PARTUM TUBAL LIGATION (Bilateral)  Anesthesia type: General  Patient location: PACU  Post pain: Pain level controlled  Post assessment: Post-op Vital signs reviewed  Last Vitals:  Filed Vitals:   08/17/13 1415  BP: 131/87  Pulse: 66  Temp:   Resp: 15    Post vital signs: Reviewed  Level of consciousness: sedated  Complications: No apparent anesthesia complications

## 2013-08-17 NOTE — Progress Notes (Signed)
Ur chart review completed.  

## 2013-08-17 NOTE — Anesthesia Postprocedure Evaluation (Addendum)
  Anesthesia Post-op Note  Anesthesia Post Note  Patient: [name]  Procedure(s) Performed: Procedure(s) (LRB): POST PARTUM TUBAL LIGATION (Bilateral)  Anesthesia type: General  Patient location: mother baby  Post pain: Pain level controlled  Post assessment: Post-op Vital signs reviewed  Last Vitals:  Filed Vitals:   08/17/13 1530  BP: 133/86  Pulse: 68  Temp: 36.8 C  Resp: 17    Post vital signs: Reviewed  Level of consciousness: sedated  Complications: No apparent anesthesia complications

## 2013-08-17 NOTE — Progress Notes (Signed)
Patient ID: [name], female   DOB: 1986/07/29, 27 y.o.   MRN: [id number] Op note:  BTL under general anesthesia. No known complications

## 2013-08-17 NOTE — Anesthesia Preprocedure Evaluation (Addendum)
Anesthesia Evaluation  Patient identified by MRN, date of birth, ID band Patient awake    Reviewed: Allergy & Precautions, H&P , NPO status , Patient's Chart, lab work & pertinent test results  Airway Mallampati: III TM Distance: >3 FB Neck ROM: Full    Dental no notable dental hx. (+) Teeth Intact   Pulmonary neg pulmonary ROS,  breath sounds clear to auscultation  Pulmonary exam normal       Cardiovascular hypertension, Rhythm:Regular Rate:Normal  Hx/o PIH with first 2 pregnancies   Neuro/Psych negative neurological ROS  negative psych ROS   GI/Hepatic negative GI ROS, Neg liver ROS,   Endo/Other  negative endocrine ROS  Renal/GU negative Renal ROS  negative genitourinary   Musculoskeletal negative musculoskeletal ROS (+)   Abdominal   Peds  Hematology negative hematology ROS (+)   Anesthesia Other Findings   Reproductive/Obstetrics Desires permanent sterilization                           Anesthesia Physical Anesthesia Plan  ASA: II  Anesthesia Plan: General   Post-op Pain Management:    Induction: Intravenous  Airway Management Planned: Oral ETT  Additional Equipment:   Intra-op Plan:   Post-operative Plan:   Informed Consent: I have reviewed the patients History and Physical, chart, labs and discussed the procedure including the risks, benefits and alternatives for the proposed anesthesia with the patient or authorized representative who has indicated his/her understanding and acceptance.   Dental advisory given  Plan Discussed with: Anesthesiologist, Surgeon and CRNA  Anesthesia Plan Comments: (Patient is refusing regional anesthesia. Risks of GA including but not limited to aspiration, post op pneumonia and death. Patient understands risks and wishes to proceed with procedure today. She does not wish to wait 6 weeks and return when it would be safer to administer GA.)        Anesthesia Quick Evaluation

## 2013-08-17 NOTE — Progress Notes (Signed)
Patient ID: [name], female   DOB: 02-Mar-1986, 27 y.o.   MRN: [id number] #1 afebrile BP normal HGB stable Pt wants BTL so is NPO.

## 2013-08-17 NOTE — Lactation Note (Signed)
This note was copied from the chart of Girl Ohanna Cantrelle. Lactation Consultation Note  Patient Name: Girl Ikeisha Blumberg [id number] Date: 08/17/2013 Reason for consult: Follow-up assessment of this mom and baby, now 30 hours of age.  Mom denies any breastfeeding concerns. Baby last fed for 10 minutes 2 hours ago.  LATCH score tonight per RN=9 and output wnl.  LC encouraged continued cue feedings.   Maternal Data    Feeding Feeding Type: Breast Milk Length of feed: 10 min  LATCH Score/Interventions Latch: Grasps breast easily, tongue down, lips flanged, rhythmical sucking.  Audible Swallowing: A few with stimulation  Type of Nipple: Everted at rest and after stimulation  Comfort (Breast/Nipple): Soft / non-tender     Hold (Positioning): No assistance needed to correctly position infant at breast.  LATCH Score: 9  (per RN assessment at 2030)  Lactation Tools Discussed/Used   Cue feedings ad lib  Consult Status Consult Status: Follow-up Date: 08/18/13 Follow-up type: In-patient    Warrick Parisian [facility] 08/17/2013, 10:53 PM

## 2013-08-18 ENCOUNTER — Encounter (HOSPITAL_COMMUNITY): Payer: Self-pay | Admitting: Obstetrics and Gynecology

## 2013-08-18 MED ORDER — HYDROCODONE-ACETAMINOPHEN 5-300 MG PO TABS: 1.0000 | ORAL_TABLET | Freq: Four times a day (QID) | ORAL | Status: DC | PRN

## 2013-08-18 MED ORDER — IBUPROFEN 600 MG PO TABS: 600.0000 mg | ORAL_TABLET | Freq: Four times a day (QID) | ORAL | Status: AC | PRN

## 2013-08-18 NOTE — Op Note (Signed)
[name], [name]       ACCOUNT NO.:  [id number]  MEDICAL RECORD NO.:  [id number]  LOCATION:  9136                          FACILITY:  [facility]  PHYSICIAN:  Malachi Pro. Ambrose Mantle, M.D. DATE OF BIRTH:  [birth year]  DATE OF PROCEDURE:  08/17/2013 DATE OF DISCHARGE:                              OPERATIVE REPORT   PREOPERATIVE DIAGNOSIS:  Voluntary sterilization.  POSTOPERATIVE DIAGNOSIS:  Voluntary sterilization.  ANESTHESIA:  General.  OPERATOR:  Malachi Pro. Ambrose Mantle, MD  OPERATION:  Bilateral tubal ligation.  DESCRIPTION OF PROCEDURE:  The patient was brought to the operating room and given general anesthesia.  She was placed in frog-leg position.  The RN in the room, prepped the urethra, inserted the Foley catheter to straight drain.  I then prepped the abdomen, draped as a sterile field. We inspected the umbilicus carefully.  There was a lot of debris down in the base of the umbilicus that cleaned out with a sponge and then re- prepped with Betadine, but chose not to make an incision that would go deep into the umbilicus.  I used 2 Allis clamps to pull the sides of the umbilicus up, incised between them, then when I removed the Allis clamps, I made the incision a little larger, dissected down to the fascia, incised the fascia transversely, entered the peritoneal cavity. I exposed both tubes to their fimbriated ends.  I saw the right ovary well, I did not see the left ovary.  I made a window in the mesosalpinx with the Bovie, placed 2 ties of 0 plain catgut proximally and distally on each tube, cut out the intervening segment.  There was a little bit of bleeding from the mesosalpinx on the left and this was controlled with the Bovie.  At this point, hemostasis was completely adequate.  I removed the retractors and closed the abdominal wall with interrupted sutures of 0 Vicryl to close the fascia and peritoneum in 1 layer. Three other sutures of figure-of-eight, a couple were just  simple interrupted sutures.  There was no reason to close the subcutaneous tissue.  I closed the skin with interrupted sutures of 3-0 plain catgut on a cutting needle.  The patient seemed to tolerate the procedure well. Blood loss about 10 mL.  Sponge and needle counts were correct and she was returned to recovery in satisfactory condition.     Malachi Pro. Ambrose Mantle, M.D.     TFH/MEDQ  D:  08/17/2013  T:  08/18/2013  Job:  [id number]

## 2013-08-18 NOTE — Progress Notes (Signed)
Patient refuses influenza vaccine and Tdap vaccine.

## 2013-08-18 NOTE — Lactation Note (Signed)
This note was copied from the chart of Jasmine Naylea Riches. Lactation Consultation Note   Follow up consult with this mom, P3, breast feeding well, denies any questionsn/concerns  Patient Name: [name] Today's Date: 08/18/2013 Reason for consult: Follow-up assessment   Maternal Data    Feeding Feeding Type: Breast Milk Length of feed: 10 min  LATCH Score/Interventions Latch: Grasps breast easily, tongue down, lips flanged, rhythmical sucking.  Audible Swallowing: A few with stimulation Intervention(s): Skin to skin  Type of Nipple: Everted at rest and after stimulation  Comfort (Breast/Nipple): Soft / non-tender     Hold (Positioning): No assistance needed to correctly position infant at breast.  LATCH Score: 9  Lactation Tools Discussed/Used     Consult Status Consult Status: Complete Follow-up type: Call as needed    Alfred Levins 08/18/2013, 10:24 AM

## 2013-08-18 NOTE — Progress Notes (Signed)
Patient ID: [name], female   DOB: [birth year], 27 y.o.   MRN: [id number] #2/#1 afebrile BP normal Abdomen soft and not tender. Tolerating liquids and voiding well For d/c

## 2013-08-19 NOTE — Discharge Summary (Signed)
[name], [name]       ACCOUNT NO.:  [id number]  MEDICAL RECORD NO.:  [id number]  LOCATION:  9136                          FACILITY:  [facility]  PHYSICIAN:  Malachi Pro. Ambrose Mantle, M.D. DATE OF BIRTH:  Jul 01, 1986  DATE OF ADMISSION:  08/16/2013 DATE OF DISCHARGE:  08/18/2013                              DISCHARGE SUMMARY   HISTORY OF PRESENT ILLNESS:  This is a 27 year old black para 2-0-1-2, gravida 4, admitted with premature rupture of the membranes, approximately 12-14 hours after the membrane rupture occurred.  Labs were okay except the rubella was nonimmune.  She had been followed in late pregnancy with testing because the fundal height was lagging. Ultrasound showed the fetus to be about the 14th percentile.  After admission to the hospital, patient was placed on Pitocin and penicillin for positive group B strep.  She declined an epidural, progressed to full dilatation and delivered a living female infant, ROA over an intact perineum.  Apgars were 8 and 9 at 1 and 5 minutes.  Mild shoulder dystocia managed by Dr. Noah Delaine and grasping the axilla.  Blood loss about 400 mL.  There was meconium-stained fluid.  The patient strongly requested tubal ligation.  She underwent tubal ligation on the first postpartum day, did well, and then was discharged on the second postpartum and first postoperative day.  LABORATORY DATA:  Showed initial hemoglobin of 13.6, hematocrit 40, white count 8500, platelet count 219,000.  RPR was nonreactive. Followup hemoglobin 13.1.  FINAL DIAGNOSES:  Intrauterine pregnancy at 39 weeks, delivered ROA; premature rupture of the membranes and voluntary sterilization.  OPERATION:  Spontaneous delivery ROA, bilateral tubal ligation.  FINAL CONDITION:  Improved.  INSTRUCTIONS:  Include our regular discharge instruction booklet after visit summary.  She is given prescriptions for Motrin 600 mg 30 tablets, 1 every 6 hours as needed for pain and Vicodin  5/300 #30 tablets 1 every 6 hours as needed for pain.  She is asked to return to the office in 10 days for followup examination.  Call with any unusual problems, any fever above 100.4 degrees, any extremely heavy bleeding, it then slowed down and stop, and get off your feet and she is advised to watch for symptoms of postpartum depression.     Malachi Pro. Ambrose Mantle, M.D.     TFH/MEDQ  D:  08/18/2013  T:  08/19/2013  Job:  [id number]

## 2014-08-28 ENCOUNTER — Encounter (HOSPITAL_BASED_OUTPATIENT_CLINIC_OR_DEPARTMENT_OTHER): Payer: Self-pay | Admitting: Emergency Medicine

## 2014-08-28 ENCOUNTER — Emergency Department (HOSPITAL_BASED_OUTPATIENT_CLINIC_OR_DEPARTMENT_OTHER)
Admission: EM | Admit: 2014-08-28 | Discharge: 2014-08-28 | Disposition: A | Discharge status: Home / Self Care | Payer: No Typology Code available for payment source | Attending: Emergency Medicine | Admitting: Emergency Medicine

## 2014-08-28 DIAGNOSIS — Y9389 Activity, other specified: Secondary | ICD-10-CM | POA: Insufficient documentation

## 2014-08-28 DIAGNOSIS — Z79899 Other long term (current) drug therapy: Secondary | ICD-10-CM | POA: Diagnosis not present

## 2014-08-28 DIAGNOSIS — S199XXA Unspecified injury of neck, initial encounter: Secondary | ICD-10-CM | POA: Diagnosis present

## 2014-08-28 DIAGNOSIS — Y92481 Parking lot as the place of occurrence of the external cause: Secondary | ICD-10-CM | POA: Insufficient documentation

## 2014-08-28 DIAGNOSIS — Z041 Encounter for examination and observation following transport accident: Secondary | ICD-10-CM

## 2014-08-28 DIAGNOSIS — I10 Essential (primary) hypertension: Secondary | ICD-10-CM | POA: Diagnosis not present

## 2014-08-28 DIAGNOSIS — S161XXA Strain of muscle, fascia and tendon at neck level, initial encounter: Secondary | ICD-10-CM | POA: Diagnosis not present

## 2014-08-28 DIAGNOSIS — Z043 Encounter for examination and observation following other accident: Secondary | ICD-10-CM

## 2014-08-28 MED ORDER — IBUPROFEN 800 MG PO TABS: 800.0000 mg | ORAL_TABLET | Freq: Three times a day (TID) | ORAL | Status: AC

## 2014-08-28 MED ORDER — CYCLOBENZAPRINE HCL 10 MG PO TABS: 10.0000 mg | ORAL_TABLET | Freq: Two times a day (BID) | ORAL | Status: DC | PRN
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
## 2014-09-18 ENCOUNTER — Encounter (HOSPITAL_BASED_OUTPATIENT_CLINIC_OR_DEPARTMENT_OTHER): Payer: Self-pay | Admitting: Emergency Medicine

## 2016-06-29 ENCOUNTER — Encounter (HOSPITAL_BASED_OUTPATIENT_CLINIC_OR_DEPARTMENT_OTHER): Payer: Self-pay | Admitting: *Deleted

## 2016-06-29 ENCOUNTER — Emergency Department (HOSPITAL_BASED_OUTPATIENT_CLINIC_OR_DEPARTMENT_OTHER)
Admission: EM | Admit: 2016-06-29 | Discharge: 2016-06-29 | Disposition: A | Discharge status: Home / Self Care | Payer: Medicaid Other | Attending: Emergency Medicine | Admitting: Emergency Medicine

## 2016-06-29 DIAGNOSIS — N898 Other specified noninflammatory disorders of vagina: Secondary | ICD-10-CM | POA: Insufficient documentation

## 2016-06-29 DIAGNOSIS — N390 Urinary tract infection, site not specified: Secondary | ICD-10-CM | POA: Insufficient documentation

## 2016-06-29 DIAGNOSIS — I10 Essential (primary) hypertension: Secondary | ICD-10-CM | POA: Insufficient documentation

## 2016-06-29 LAB — COMPREHENSIVE METABOLIC PANEL
ALT: 13 U/L — ABNORMAL LOW (ref 14–54)
AST: 16 U/L (ref 15–41)
Albumin: 3.9 g/dL (ref 3.5–5.0)
Alkaline Phosphatase: 60 U/L (ref 38–126)
Anion gap: 3 — ABNORMAL LOW (ref 5–15)
BUN: 8 mg/dL (ref 6–20)
CO2: 29 mmol/L (ref 22–32)
Calcium: 8.9 mg/dL (ref 8.9–10.3)
Chloride: 106 mmol/L (ref 101–111)
Creatinine, Ser: 0.56 mg/dL (ref 0.44–1.00)
GFR calc Af Amer: >60 mL/min (ref >60)
GFR calc non Af Amer: >60 mL/min (ref >60)
Glucose, Bld: 91 mg/dL (ref 65–99)
Potassium: 4.4 mmol/L (ref 3.5–5.1)
Sodium: 138 mmol/L (ref 135–145)
Total Bilirubin: 0.4 mg/dL (ref 0.3–1.2)
Total Protein: 7.3 g/dL (ref 6.5–8.1)

## 2016-06-29 LAB — CBC WITH DIFFERENTIAL/PLATELET
Basophils Absolute: 0 10*3/uL (ref 0.0–0.1)
Basophils Relative: 1 %
Eosinophils Absolute: 0.1 10*3/uL (ref 0.0–0.7)
Eosinophils Relative: 1 %
HCT: 40.7 % (ref 36.0–46.0)
Hemoglobin: 13.4 g/dL (ref 12.0–15.0)
Lymphocytes Relative: 29 %
Lymphs Abs: 2 10*3/uL (ref 0.7–4.0)
MCH: 28.9 pg (ref 26.0–34.0)
MCHC: 32.9 g/dL (ref 30.0–36.0)
MCV: 87.7 fL (ref 78.0–100.0)
Monocytes Absolute: 0.5 10*3/uL (ref 0.1–1.0)
Monocytes Relative: 7 %
Neutro Abs: 4.2 10*3/uL (ref 1.7–7.7)
Neutrophils Relative %: 62 %
Platelets: 276 10*3/uL (ref 150–400)
RBC: 4.64 MIL/uL (ref 3.87–5.11)
RDW: 12.5 % (ref 11.5–15.5)
WBC: 6.8 10*3/uL (ref 4.0–10.5)

## 2016-06-29 LAB — URINE MICROSCOPIC-ADD ON: RBC / HPF: NONE SEEN RBC/hpf (ref 0–5)

## 2016-06-29 LAB — URINALYSIS, ROUTINE W REFLEX MICROSCOPIC
Bilirubin Urine: NEGATIVE
Glucose, UA: NEGATIVE mg/dL
HGB URINE DIPSTICK: NEGATIVE
Ketones, ur: NEGATIVE mg/dL
Nitrite: NEGATIVE
PROTEIN: NEGATIVE mg/dL
Specific Gravity, Urine: 1.027 (ref 1.005–1.030)
pH: 7 (ref 5.0–8.0)

## 2016-06-29 LAB — PREGNANCY, URINE: Preg Test, Ur: NEGATIVE

## 2016-06-29 LAB — HCG, QUANTITATIVE, PREGNANCY: hCG, Beta Chain, Quant, S: <1 m[IU]/mL (ref <5)

## 2016-06-29 LAB — LIPASE, BLOOD: Lipase: 23 U/L (ref 11–51)

## 2016-06-29 MED ORDER — CEPHALEXIN 500 MG PO CAPS: 500.0000 mg | ORAL_CAPSULE | Freq: Two times a day (BID) | ORAL | 0 refills | Status: DC

## 2016-06-29 MED ORDER — CEPHALEXIN 250 MG PO CAPS
500.0000 mg | ORAL_CAPSULE | Freq: Once | ORAL | Status: AC
  Administered 2016-06-29: 500 mg via ORAL
  Filled 2016-06-29: qty 2

## 2016-06-29 NOTE — Discharge Instructions (Signed)
Medications: Keflex  Treatment: Take Keflex twice daily for 1 week for your urinary tract infection. Make sure to drink plenty of water, at least 8 glasses of water daily.  Follow-up: Please return to emergency department if you develop any new or worsening symptoms including fever, abdominal pain. Please follow-up with your OB/GYN , establish care with a primary care provider, or return to emergency department if your symptoms are not resolving following treatment of urinary tract infection.

## 2016-06-29 NOTE — ED Provider Notes (Signed)
[facility] Provider Note   CSN: [id number] Arrival date & time: 06/29/16  1191  First Provider Contact:  First MD Initiated Contact with Patient 06/29/16 [phone]        History   Chief Complaint Chief Complaint  Patient presents with  . Emesis    HPI [name] is a 30 y.o. female with history of tubal ligation 3 years ago who presents with 5 day history of AM emesis. Patient reports that she was awakened out of sleep early Wednesday morning with her mouth watering and clear emesis. Besides her mouth watering, patient denies any nausea in her abdomen. Patient states that she has had one episode of emesis daily except for today, she had to. Patient states that this occurred before and after eating, but the emesis stays clear each episode. Patient has not been taking any medications for this denies any new medications. Patient denies any fevers, chills, abdominal pain, chest pain, shortness of breath, diarrhea, constipation, urinary symptoms, vaginal discharge or bleeding. Patient's LMP was in the past 2 weeks. Patient does not have a PCP besides her OBGYN. Patient took a negative home pregnancy test on Friday. She requests a blood pregnancy test.  HPI  Past Medical History:  Diagnosis Date  . Hypertension     There are no active problems to display for this patient.   Past Surgical History:  Procedure Laterality Date  . TUBAL LIGATION Bilateral 08/17/2013   Procedure: POST PARTUM TUBAL LIGATION;  Surgeon: Bing Plume, MD;  Location: [facility];  Service: Gynecology;  Laterality: Bilateral;  . VAGINAL DELIVERY     X 2    OB History    Gravida Para Term Preterm AB Living   3 3 3     3    SAB TAB Ectopic Multiple Live Births           1       Home Medications    Prior to Admission medications   Medication Sig Start Date End Date Taking? Authorizing Provider  cephALEXin (KEFLEX) 500 MG capsule Take 1 capsule (500 mg total) by mouth 2 (two) times  daily. 06/29/16   Emi Holes, PA-C  cyclobenzaprine (FLEXERIL) 10 MG tablet Take 1 tablet (10 mg total) by mouth 2 (two) times daily as needed for muscle spasms. 08/28/14   Arby Barrette, MD  Hydrocodone-Acetaminophen (VICODIN) 5-300 MG TABS Take 1 tablet by mouth 4 (four) times daily as needed. 08/18/13   Tracey Harries, MD  ibuprofen (ADVIL,MOTRIN) 600 MG tablet Take 1 tablet (600 mg total) by mouth every 6 (six) hours as needed for pain. 08/18/13   Tracey Harries, MD  ibuprofen (ADVIL,MOTRIN) 800 MG tablet Take 1 tablet (800 mg total) by mouth 3 (three) times daily. 08/28/14   Arby Barrette, MD  Prenatal Vit-Fe Fumarate-FA (PRENATAL MULTIVITAMIN) TABS Take 1 tablet by mouth daily at 12 noon.    Historical Provider, MD    Family History No family history on file.  Social History Social History  Substance Use Topics  . Smoking status: Never Smoker  . Smokeless tobacco: Never Used  . Alcohol use Yes     Comment: occassionally     Allergies   Review of patient's allergies indicates no known allergies.   Review of Systems Review of Systems  Constitutional: Negative for chills and fever.  HENT: Negative for facial swelling and sore throat.   Respiratory: Negative for shortness of breath.   Cardiovascular: Negative for chest pain.  Gastrointestinal: Positive  for vomiting. Negative for abdominal pain and nausea.  Genitourinary: Negative for dysuria, frequency, pelvic pain, vaginal bleeding and vaginal discharge.  Musculoskeletal: Negative for back pain.  Skin: Negative for rash and wound.  Neurological: Negative for headaches.  Psychiatric/Behavioral: The patient is not nervous/anxious.      Physical Exam Updated Vital Signs BP 137/89 (BP Location: Right Arm)   Pulse 72   Temp 98.2 F (36.8 C) (Oral)   Resp 18   Ht 5' (1.524 m)   Wt 77.1 kg   LMP 06/18/2016 (Exact Date)   SpO2 100%   BMI 33.20 kg/m   Physical Exam  Constitutional: She appears well-developed and  well-nourished. No distress.  HENT:  Head: Normocephalic and atraumatic.  Mouth/Throat: Oropharynx is clear and moist. No oropharyngeal exudate.  Eyes: Conjunctivae are normal. Pupils are equal, round, and reactive to light. Right eye exhibits no discharge. Left eye exhibits no discharge. No scleral icterus.  Neck: Normal range of motion. Neck supple. No thyromegaly present.  Cardiovascular: Normal rate, regular rhythm and normal heart sounds.  Exam reveals no gallop and no friction rub.   No murmur heard. Pulmonary/Chest: Effort normal and breath sounds normal. No stridor. No respiratory distress. She has no wheezes. She has no rales.  Abdominal: Soft. Bowel sounds are normal. She exhibits no distension. There is no tenderness. There is no rebound and no guarding.  Musculoskeletal: She exhibits no edema.  Lymphadenopathy:    She has no cervical adenopathy.  Neurological: She is alert. Coordination normal.  Skin: Skin is warm and dry. No rash noted. She is not diaphoretic. No pallor.  Psychiatric: She has a normal mood and affect.  Nursing note and vitals reviewed.    ED Treatments / Results  Labs (all labs ordered are listed, but only abnormal results are displayed) Labs Reviewed  URINALYSIS, ROUTINE W REFLEX MICROSCOPIC (NOT AT [facility]) - Abnormal; Notable for the following:       Result Value   APPearance CLOUDY (*)    Leukocytes, UA SMALL (*)    All other components within normal limits  COMPREHENSIVE METABOLIC PANEL - Abnormal; Notable for the following:    ALT 13 (*)    Anion gap 3 (*)    All other components within normal limits  URINE MICROSCOPIC-ADD ON - Abnormal; Notable for the following:    Squamous Epithelial / LPF 0-5 (*)    Bacteria, UA MANY (*)    All other components within normal limits  PREGNANCY, URINE  LIPASE, BLOOD  CBC WITH DIFFERENTIAL/PLATELET  HCG, QUANTITATIVE, PREGNANCY    EKG  EKG Interpretation None       Radiology No results  found.  Procedures Procedures (including critical care time)  Medications Ordered in ED Medications  cephALEXin (KEFLEX) capsule 500 mg (not administered)     Initial Impression / Assessment and Plan / ED Course  I have reviewed the triage vital signs and the nursing notes.  Pertinent labs & imaging results that were available during my care of the patient were reviewed by me and considered in my medical decision making (see chart for details).  Clinical Course    CBC, Lipase, unremarkable. CMP shows ALT 13, anion gap 3. HCG quant <1.UA shows small leukocytes, many bacteria, 0-5 squamous. Urine pregnancy negative. Seemingly clean sample. Will treat UTI with Keflex. Patient denies nausea at this time. Patient offered Zofran, however because her symptoms are so mild, patient declined. Return precautions discussed. Patient advised to follow up and establish care  with PCP and to see PCP, OB/GYN, or return to emergency department if her symptoms are not resolving following treatment. Patient understands and agrees with plan. Patient vitals stable throughout ED course and discharged in satisfactory condition.  Final Clinical Impressions(s) / ED Diagnoses   Final diagnoses:  UTI (lower urinary tract infection)    New Prescriptions New Prescriptions   CEPHALEXIN (KEFLEX) 500 MG CAPSULE    Take 1 capsule (500 mg total) by mouth 2 (two) times daily.     Emi Holeslexandra M Tracina Beaumont, PA-C 06/29/16 1059    Jacalyn LefevreJulie Haviland, MD 06/29/16 1540

## 2016-06-29 NOTE — ED Triage Notes (Signed)
Patient states she has a three day history of vomiting 1 x day.  Denies any nausea or abdominal pain.  States the vomiting is happening only once a day in the morning.

## 2018-02-11 ENCOUNTER — Encounter (HOSPITAL_BASED_OUTPATIENT_CLINIC_OR_DEPARTMENT_OTHER): Payer: Self-pay | Admitting: *Deleted

## 2018-02-11 ENCOUNTER — Emergency Department (HOSPITAL_BASED_OUTPATIENT_CLINIC_OR_DEPARTMENT_OTHER)
Admission: EM | Admit: 2018-02-11 | Discharge: 2018-02-11 | Disposition: A | Discharge status: Home / Self Care | Payer: No Typology Code available for payment source | Attending: Emergency Medicine | Admitting: Emergency Medicine

## 2018-02-11 ENCOUNTER — Emergency Department (HOSPITAL_BASED_OUTPATIENT_CLINIC_OR_DEPARTMENT_OTHER): Payer: No Typology Code available for payment source

## 2018-02-11 ENCOUNTER — Other Ambulatory Visit: Payer: Self-pay

## 2018-02-11 DIAGNOSIS — R51 Headache: Secondary | ICD-10-CM | POA: Diagnosis present

## 2018-02-11 DIAGNOSIS — I10 Essential (primary) hypertension: Secondary | ICD-10-CM | POA: Diagnosis not present

## 2018-02-11 DIAGNOSIS — W2210XD Striking against or struck by unspecified automobile airbag, subsequent encounter: Secondary | ICD-10-CM | POA: Insufficient documentation

## 2018-02-11 DIAGNOSIS — M545 Low back pain, unspecified: Secondary | ICD-10-CM

## 2018-02-11 DIAGNOSIS — S060X0D Concussion without loss of consciousness, subsequent encounter: Secondary | ICD-10-CM | POA: Diagnosis not present

## 2018-02-11 MED ORDER — DEXAMETHASONE SODIUM PHOSPHATE 10 MG/ML IJ SOLN
10.0000 mg | Freq: Once | INTRAMUSCULAR | Status: AC
  Administered 2018-02-11: 10 mg via INTRAVENOUS
  Filled 2018-02-11: qty 1

## 2018-02-11 MED ORDER — KETOROLAC TROMETHAMINE 30 MG/ML IJ SOLN
30.0000 mg | Freq: Once | INTRAMUSCULAR | Status: AC
  Administered 2018-02-11: 30 mg via INTRAVENOUS
  Filled 2018-02-11: qty 1

## 2018-02-11 MED ORDER — ONDANSETRON HCL 4 MG PO TABS: 4.0000 mg | ORAL_TABLET | Freq: Four times a day (QID) | ORAL | 0 refills | Status: AC

## 2018-02-11 MED ORDER — METOCLOPRAMIDE HCL 5 MG/ML IJ SOLN
10.0000 mg | Freq: Once | INTRAMUSCULAR | Status: AC
  Administered 2018-02-11: 10 mg via INTRAVENOUS
  Filled 2018-02-11: qty 2

## 2018-02-11 MED ORDER — SODIUM CHLORIDE 0.9 % IV BOLUS
1000.0000 mL | Freq: Once | INTRAVENOUS | Status: AC
  Administered 2018-02-11: 1000 mL via INTRAVENOUS
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
## 2018-02-11 NOTE — Discharge Instructions (Signed)
You may continue using the ibuprofen, Tylenol and muscle relaxer as needed.  Use the Zofran as needed for nausea.  Make sure you are drinking plenty of fluids and getting lots of rest.  Avoid staring at computers are small screens.
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
## 2018-02-11 NOTE — ED Notes (Signed)
NAD at this time. Pt is stable and going home.  

## 2018-02-17 MED FILL — ONDANSETRON HCL 4 MG TABLET: 4 | 3 days supply | Qty: 12 | Fill #0

## 2018-04-26 ENCOUNTER — Other Ambulatory Visit: Payer: Self-pay

## 2018-04-26 ENCOUNTER — Emergency Department (HOSPITAL_BASED_OUTPATIENT_CLINIC_OR_DEPARTMENT_OTHER)
Admission: EM | Admit: 2018-04-26 | Discharge: 2018-04-26 | Disposition: A | Discharge status: Home / Self Care | Payer: No Typology Code available for payment source | Attending: Emergency Medicine | Admitting: Emergency Medicine

## 2018-04-26 ENCOUNTER — Encounter (HOSPITAL_BASED_OUTPATIENT_CLINIC_OR_DEPARTMENT_OTHER): Payer: Self-pay

## 2018-04-26 DIAGNOSIS — T20012A Burn of unspecified degree of left ear [any part, except ear drum], initial encounter: Secondary | ICD-10-CM | POA: Diagnosis present

## 2018-04-26 DIAGNOSIS — X088XXA Exposure to other specified smoke, fire and flames, initial encounter: Secondary | ICD-10-CM | POA: Diagnosis not present

## 2018-04-26 DIAGNOSIS — Y929 Unspecified place or not applicable: Secondary | ICD-10-CM | POA: Diagnosis not present

## 2018-04-26 DIAGNOSIS — T310 Burns involving less than 10% of body surface: Secondary | ICD-10-CM | POA: Insufficient documentation

## 2018-04-26 DIAGNOSIS — Y939 Activity, unspecified: Secondary | ICD-10-CM | POA: Insufficient documentation

## 2018-04-26 DIAGNOSIS — T300 Burn of unspecified body region, unspecified degree: Secondary | ICD-10-CM

## 2018-04-26 DIAGNOSIS — I10 Essential (primary) hypertension: Secondary | ICD-10-CM | POA: Diagnosis not present

## 2018-04-26 DIAGNOSIS — Z79899 Other long term (current) drug therapy: Secondary | ICD-10-CM | POA: Diagnosis not present

## 2018-04-26 DIAGNOSIS — Y999 Unspecified external cause status: Secondary | ICD-10-CM | POA: Diagnosis not present

## 2018-04-26 MED ORDER — BACITRACIN-NEOMYCIN-POLYMYXIN 400-5-5000 EX OINT: 1.0000 "application " | TOPICAL_OINTMENT | Freq: Two times a day (BID) | CUTANEOUS | 0 refills | Status: AC

## 2018-04-26 NOTE — ED Notes (Signed)
ED Provider at bedside. 
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
## 2018-12-30 ENCOUNTER — Encounter (HOSPITAL_BASED_OUTPATIENT_CLINIC_OR_DEPARTMENT_OTHER): Payer: Self-pay | Admitting: Emergency Medicine

## 2018-12-30 ENCOUNTER — Other Ambulatory Visit: Payer: Self-pay

## 2018-12-30 ENCOUNTER — Emergency Department (HOSPITAL_BASED_OUTPATIENT_CLINIC_OR_DEPARTMENT_OTHER)
Admission: EM | Admit: 2018-12-30 | Discharge: 2018-12-30 | Disposition: A | Discharge status: Home / Self Care | Payer: PRIVATE HEALTH INSURANCE | Attending: Emergency Medicine | Admitting: Emergency Medicine

## 2018-12-30 DIAGNOSIS — R519 Headache, unspecified: Secondary | ICD-10-CM

## 2018-12-30 DIAGNOSIS — I10 Essential (primary) hypertension: Secondary | ICD-10-CM | POA: Insufficient documentation

## 2018-12-30 DIAGNOSIS — Z79899 Other long term (current) drug therapy: Secondary | ICD-10-CM | POA: Insufficient documentation

## 2018-12-30 DIAGNOSIS — R51 Headache: Secondary | ICD-10-CM | POA: Insufficient documentation

## 2018-12-30 DIAGNOSIS — R109 Unspecified abdominal pain: Secondary | ICD-10-CM

## 2018-12-30 LAB — CBC
HCT: 44.1 % (ref 36.0–46.0)
HEMOGLOBIN: 13.8 g/dL (ref 12.0–15.0)
MCH: 28.3 pg (ref 26.0–34.0)
MCHC: 31.3 g/dL (ref 30.0–36.0)
MCV: 90.6 fL (ref 80.0–100.0)
NRBC: 0 % (ref 0.0–0.2)
Platelets: 267 10*3/uL (ref 150–400)
RBC: 4.87 MIL/uL (ref 3.87–5.11)
RDW: 12.5 % (ref 11.5–15.5)
WBC: 8.1 10*3/uL (ref 4.0–10.5)

## 2018-12-30 LAB — COMPREHENSIVE METABOLIC PANEL
ALBUMIN: 4.1 g/dL (ref 3.5–5.0)
ALT: 15 U/L (ref 0–44)
AST: 17 U/L (ref 15–41)
Alkaline Phosphatase: 52 U/L (ref 38–126)
Anion gap: 7 (ref 5–15)
BUN: 8 mg/dL (ref 6–20)
CALCIUM: 8.8 mg/dL — AB (ref 8.9–10.3)
CHLORIDE: 107 mmol/L (ref 98–111)
CO2: 23 mmol/L (ref 22–32)
Creatinine, Ser: 0.62 mg/dL (ref 0.44–1.00)
GFR calc non Af Amer: >60 mL/min (ref >60)
GLUCOSE: 95 mg/dL (ref 70–99)
POTASSIUM: 4 mmol/L (ref 3.5–5.1)
Sodium: 137 mmol/L (ref 135–145)
Total Bilirubin: 0.7 mg/dL (ref 0.3–1.2)
Total Protein: 7.6 g/dL (ref 6.5–8.1)

## 2018-12-30 LAB — URINALYSIS, ROUTINE W REFLEX MICROSCOPIC
Bilirubin Urine: NEGATIVE
GLUCOSE, UA: NEGATIVE mg/dL
HGB URINE DIPSTICK: NEGATIVE
Ketones, ur: NEGATIVE mg/dL
LEUKOCYTE UA: NEGATIVE
Nitrite: NEGATIVE
Protein, ur: NEGATIVE mg/dL
SPECIFIC GRAVITY, URINE: 1.015 (ref 1.005–1.030)
pH: 8.5 — ABNORMAL HIGH (ref 5.0–8.0)

## 2018-12-30 LAB — LIPASE, BLOOD: LIPASE: 23 U/L (ref 11–51)

## 2018-12-30 LAB — PREGNANCY, URINE: PREG TEST UR: NEGATIVE

## 2018-12-30 MED ORDER — IBUPROFEN 400 MG PO TABS
600.0000 mg | ORAL_TABLET | Freq: Once | ORAL | Status: AC
  Administered 2018-12-30: 600 mg via ORAL
  Filled 2018-12-30: qty 1

## 2018-12-30 NOTE — ED Notes (Signed)
C/o high bp x 2-3 weeks  Is checking up to 3 times per day,  Vomited x 3 yesterday  Woke w left lower side pain this am

## 2018-12-30 NOTE — ED Provider Notes (Signed)
[facility] EMERGENCY DEPARTMENT Provider Note   CSN: [id number] Arrival date & time: 12/30/18  6962     History   Chief Complaint Chief Complaint  Patient presents with  . Hypertension  . Abdominal Pain    HPI [name] is a 33 y.o. female who presents with high blood pressure, headache, L flank pain. The patient states that when she had a PCP she was monitoring her BP at home because it's been borderline and runs in her family. It's been ranging 140s-150s systolic and ~90s diastolic. Over the past couple days she's had a constant frontal headache. Yesterday at work the headache was more severe and she thought it was from not eating so she got up, walked around, ate something, and then had several episodes of vomiting. Last night she woke up in the middle of the night with L "side" pain. It's like a constant nagging sensation. She thinks this is because she had a new job and sits a lot. She continues to have a frontal headache despite trying Tylenol and Excedrin. She denies fever, dizziness, vision changes, neck pain, LOC. She denies chest pain, SOB, current nausea, further vomiting, diarrhea/constipation, urinary symptoms, vaginal discharge or bleeding. She is going to get a new PCP once her insurance kicks in for her new job in March.  HPI  Past Medical History:  Diagnosis Date  . Hypertension     There are no active problems to display for this patient.   Past Surgical History:  Procedure Laterality Date  . TUBAL LIGATION Bilateral 08/17/2013   Procedure: POST PARTUM TUBAL LIGATION;  Surgeon: Bing Plume, MD;  Location: [facility];  Service: Gynecology;  Laterality: Bilateral;  . VAGINAL DELIVERY     X 2     OB History    Gravida  3   Para  3   Term  3   Preterm      AB      Living  3     SAB      TAB      Ectopic      Multiple      Live Births  1            Home Medications    Prior to Admission medications   Medication  Sig Start Date End Date Taking? Authorizing Provider  ibuprofen (ADVIL,MOTRIN) 600 MG tablet Take 1 tablet (600 mg total) by mouth every 6 (six) hours as needed for pain. 08/18/13   Tracey Harries, MD  ibuprofen (ADVIL,MOTRIN) 800 MG tablet Take 1 tablet (800 mg total) by mouth 3 (three) times daily. 08/28/14   Arby Barrette, MD  neomycin-bacitracin-polymyxin (NEOSPORIN) ointment Apply 1 application topically every 12 (twelve) hours. 04/26/18   Mesner, Barbara Cower, MD  ondansetron (ZOFRAN) 4 MG tablet Take 1 tablet (4 mg total) by mouth every 6 (six) hours. 02/11/18   Gwyneth Sprout, MD  Prenatal Vit-Fe Fumarate-FA (PRENATAL MULTIVITAMIN) TABS Take 1 tablet by mouth daily at 12 noon.    [provider]    Family History History reviewed. No pertinent family history.  Social History Social History   Tobacco Use  . Smoking status: Never Smoker  . Smokeless tobacco: Never Used  Substance Use Topics  . Alcohol use: Yes    Comment: occassionally  . Drug use: No     Allergies   Patient has no known allergies.   Review of Systems Review of Systems  Constitutional: Negative for fever.  Eyes: Positive for  photophobia. Negative for visual disturbance.  Respiratory: Negative for shortness of breath.   Cardiovascular: Negative for chest pain.  Gastrointestinal: Positive for abdominal pain, nausea and vomiting (resolved). Negative for constipation and diarrhea.  Genitourinary: Negative for dysuria, vaginal bleeding and vaginal discharge.  Neurological: Positive for headaches. Negative for dizziness, syncope, weakness and numbness.  All other systems reviewed and are negative.    Physical Exam Updated Vital Signs BP (!) 132/98 (BP Location: Right Arm)   Pulse 88   Temp 98.8 F (37.1 C) (Oral)   Resp 18   Ht 5' (1.524 m)   Wt 63.5 kg   LMP 12/12/2018 (Approximate)   SpO2 100%   BMI 27.34 kg/m   Physical Exam Vitals signs and nursing note reviewed.  Constitutional:       General: She is not in acute distress.    Appearance: She is well-developed.     Comments: Calm, cooperative, well appearing, NAD  HENT:     Head: Normocephalic and atraumatic.  Eyes:     General: No scleral icterus.       Right eye: No discharge.        Left eye: No discharge.     Conjunctiva/sclera: Conjunctivae normal.     Pupils: Pupils are equal, round, and reactive to light.  Neck:     Musculoskeletal: Normal range of motion.  Cardiovascular:     Rate and Rhythm: Normal rate.  Pulmonary:     Effort: Pulmonary effort is normal. No respiratory distress.  Abdominal:     General: Abdomen is flat. Bowel sounds are normal. There is no distension.     Palpations: Abdomen is soft.     Tenderness: There is no abdominal tenderness.     Hernia: No hernia is present.  Skin:    General: Skin is warm and dry.  Neurological:     Mental Status: She is alert and oriented to person, place, and time.     Comments: Lying on stretcher in NAD. GCS 15. Speaks in a clear voice. Cranial nerves II through XII grossly intact. 5/5 strength in all extremities. Sensation fully intact.  Bilateral finger-to-nose intact. Ambulatory    Psychiatric:        Behavior: Behavior normal.      ED Treatments / Results  Labs (all labs ordered are listed, but only abnormal results are displayed) Labs Reviewed  COMPREHENSIVE METABOLIC PANEL - Abnormal; Notable for the following components:      Result Value   Calcium 8.8 (*)    All other components within normal limits  URINALYSIS, ROUTINE W REFLEX MICROSCOPIC - Abnormal; Notable for the following components:   pH 8.5 (*)    All other components within normal limits  LIPASE, BLOOD  CBC  PREGNANCY, URINE    EKG None  Radiology No results found.  Procedures Procedures (including critical care time)  Medications Ordered in ED Medications - No data to display   Initial Impression / Assessment and Plan / ED Course  I have reviewed the triage  vital signs and the nursing notes.  Pertinent labs & imaging results that were available during my care of the patient were reviewed by me and considered in my medical decision making (see chart for details).  33 year old female presents with elevated blood pressures, headache, left flank pain.  Her blood pressure is something that she has been monitoring for a long time.  It is mildly elevated here.  Her other vital signs are normal.  Her neurologic  exam is normal.  She is also complaining of some left flank pain however she is nontender on my exam.  Her blood work, urine, pregnancy test are all normal.  Had discussion about possibly starting blood pressure medication today.  She is comfortable with continuing lifestyle changes and following up with a primary care provider.  She will go see 1 next month.  She was given return precautions if she is worsening.  Final Clinical Impressions(s) / ED Diagnoses   Final diagnoses:  Hypertension, unspecified type  Left flank pain  Acute nonintractable headache, unspecified headache type    ED Discharge Orders    None       Bethel BornGekas, Kelly Marie, PA-C 12/30/18 1048    Vanetta MuldersZackowski, Scott, MD 01/03/19 [phone]

## 2018-12-30 NOTE — ED Triage Notes (Signed)
Reports hypertension at home for the past 2 days.  States at home yesterday it was 145/120.  States this morning it was 130/90.    Additionally endorses LLQ abdominal pain with 3 episodes of vomiting.  Denies diarrhea.

## 2018-12-30 NOTE — Discharge Instructions (Signed)
Please follow up with a primary doctor Return if you are worsening 
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
## 2020-04-04 ENCOUNTER — Emergency Department (HOSPITAL_BASED_OUTPATIENT_CLINIC_OR_DEPARTMENT_OTHER): Payer: Self-pay

## 2020-04-04 ENCOUNTER — Encounter (HOSPITAL_BASED_OUTPATIENT_CLINIC_OR_DEPARTMENT_OTHER): Payer: Self-pay

## 2020-04-04 ENCOUNTER — Other Ambulatory Visit: Payer: Self-pay

## 2020-04-04 DIAGNOSIS — M25571 Pain in right ankle and joints of right foot: Secondary | ICD-10-CM | POA: Insufficient documentation

## 2020-04-04 DIAGNOSIS — Z5321 Procedure and treatment not carried out due to patient leaving prior to being seen by health care provider: Secondary | ICD-10-CM | POA: Insufficient documentation

## 2020-04-04 NOTE — ED Triage Notes (Signed)
Pt c/o pain to right ankle after playing in yard this pm-does not recall injury-pain to ankle x 1 hour-NAD-to triage in w/c

## 2020-04-05 ENCOUNTER — Emergency Department (HOSPITAL_BASED_OUTPATIENT_CLINIC_OR_DEPARTMENT_OTHER)
Admission: EM | Admit: 2020-04-05 | Discharge: 2020-04-05 | Disposition: A | Discharge status: Home / Self Care | Payer: Self-pay | Attending: Emergency Medicine | Admitting: Emergency Medicine

## 2021-02-23 IMAGING — CR DG ANKLE COMPLETE 3+V*R*
3 series · 3 of 3 positions shown · non-contrast
Comparison: None.

CLINICAL DATA: Pain

EXAM:
RIGHT ANKLE - COMPLETE 3+ VIEW

[t ankle joint ap right]
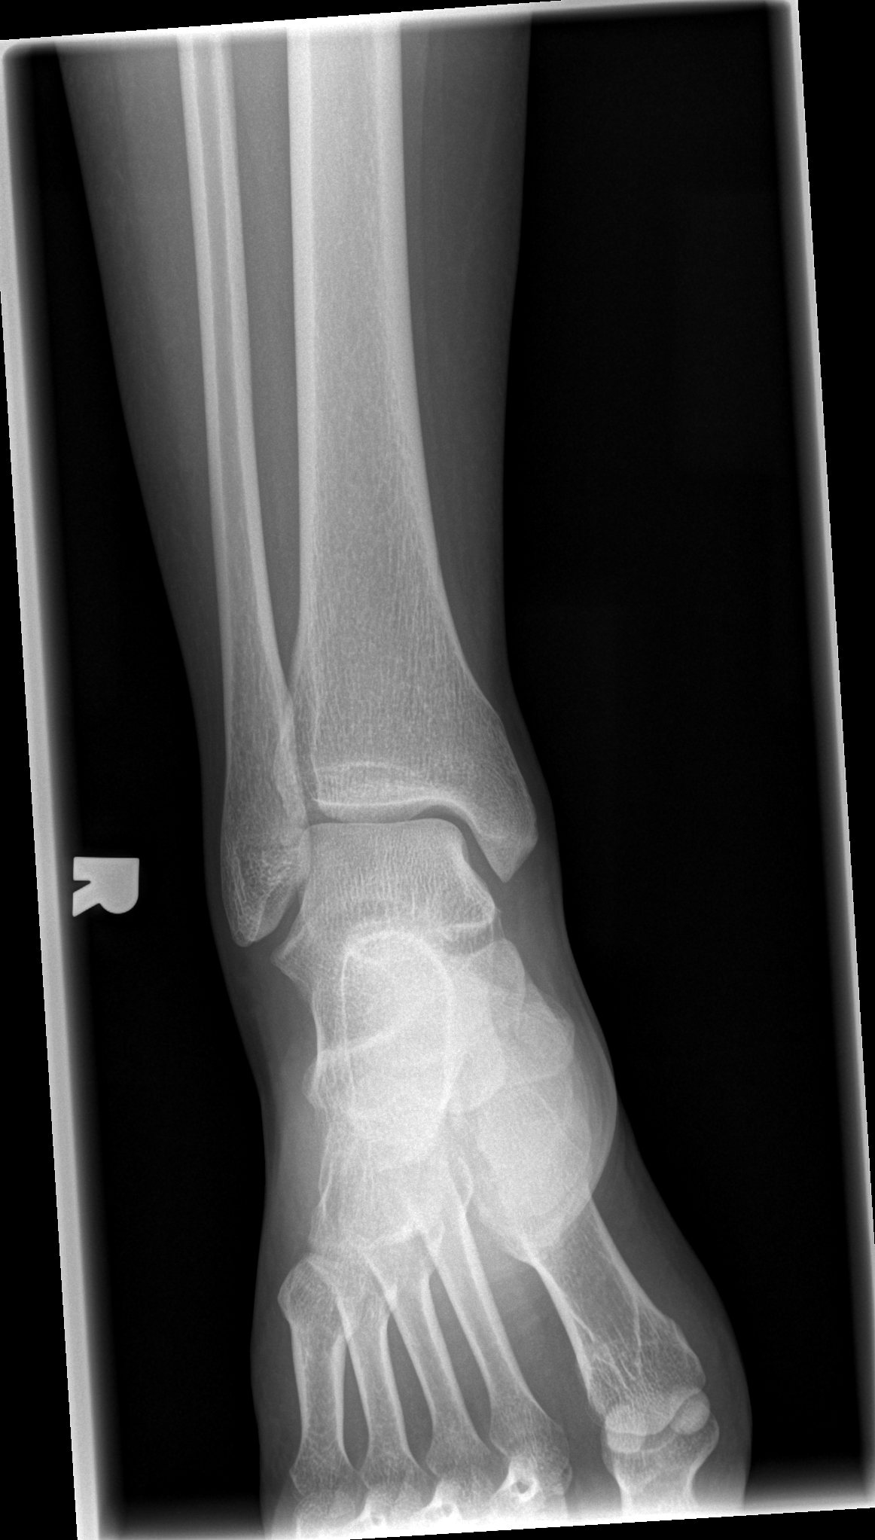

[t ankle joint oblique right]
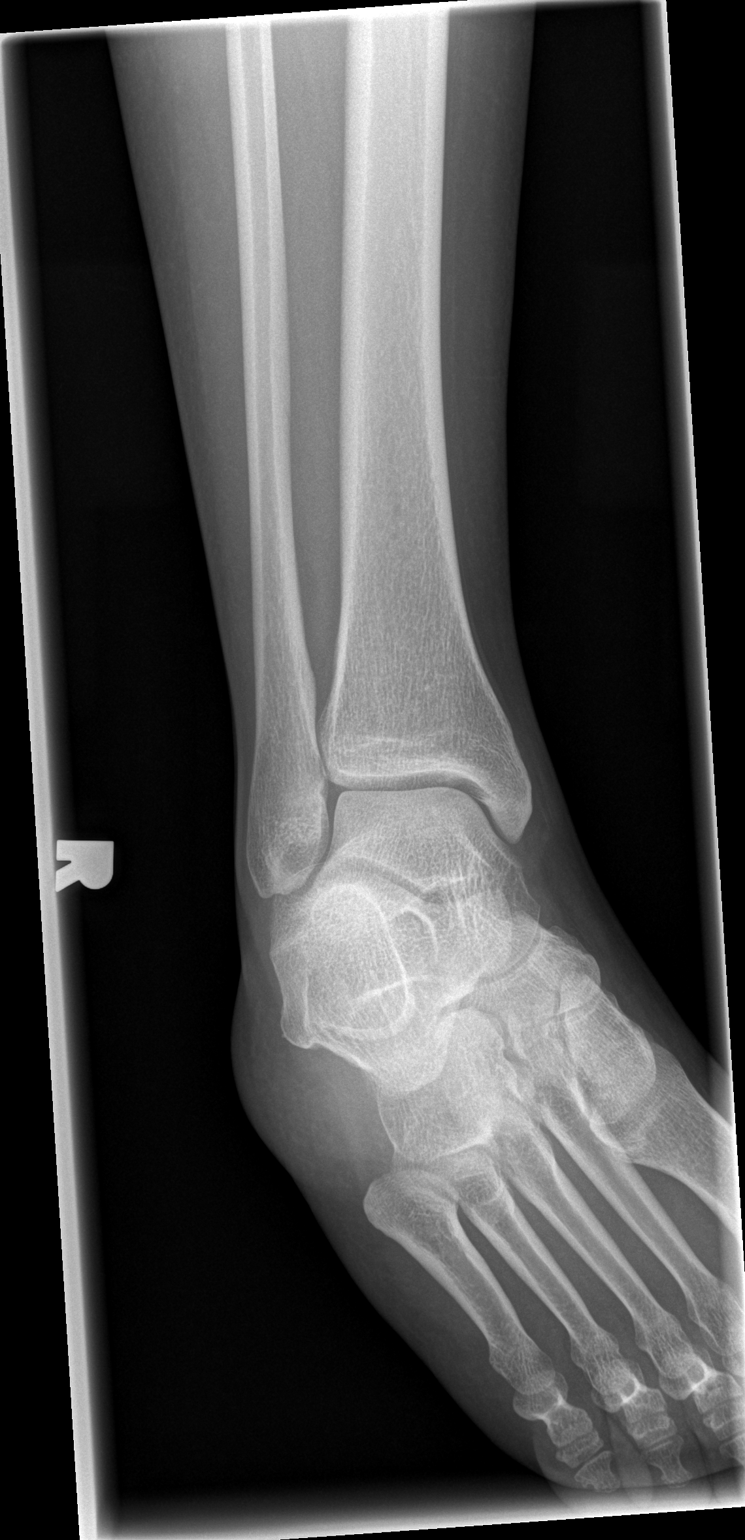

[t ankle joint lat right]
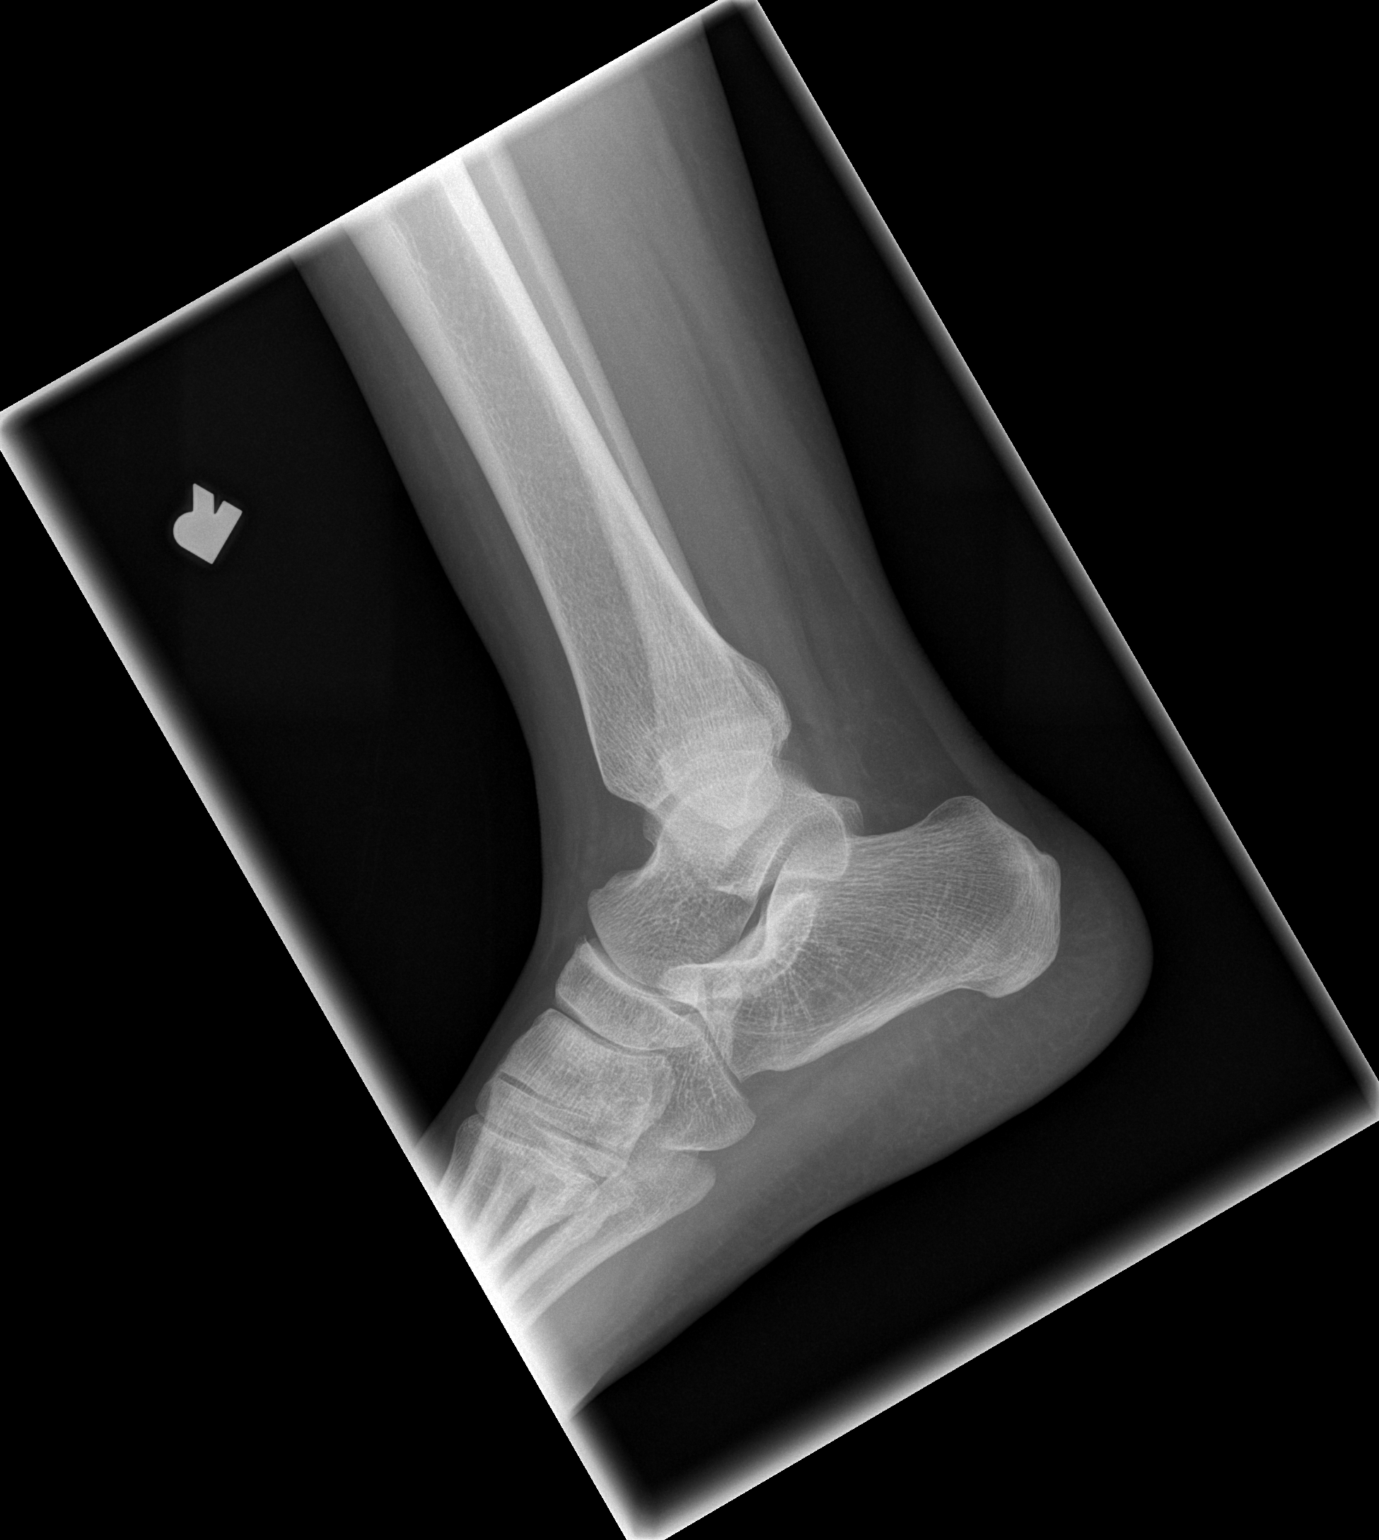

[3 of 3 positions shown; findings below may reference images not displayed]

FINDINGS: There is no evidence of fracture, dislocation, or joint effusion.
There is no evidence of arthropathy or other focal bone abnormality.
Soft tissues are unremarkable.
IMPRESSION: Negative.

## 2021-12-03 ENCOUNTER — Emergency Department (HOSPITAL_BASED_OUTPATIENT_CLINIC_OR_DEPARTMENT_OTHER)
Admission: EM | Admit: 2021-12-03 | Discharge: 2021-12-03 | Disposition: A | Discharge status: Home / Self Care | Payer: Medicaid Other | Attending: Emergency Medicine | Admitting: Emergency Medicine

## 2021-12-03 ENCOUNTER — Encounter (HOSPITAL_BASED_OUTPATIENT_CLINIC_OR_DEPARTMENT_OTHER): Payer: Self-pay

## 2021-12-03 ENCOUNTER — Other Ambulatory Visit: Payer: Self-pay

## 2021-12-03 DIAGNOSIS — J029 Acute pharyngitis, unspecified: Secondary | ICD-10-CM | POA: Diagnosis not present

## 2021-12-03 DIAGNOSIS — H9201 Otalgia, right ear: Secondary | ICD-10-CM | POA: Insufficient documentation

## 2021-12-03 NOTE — ED Triage Notes (Signed)
Pt c/o right earache x 3 days-NAD-steady gait 

## 2021-12-03 NOTE — Discharge Instructions (Signed)
Fortunately, exam of the ear does not show any signs of infection or fluid buildup..  Your throat also does not look raw or swollen.  It is possible that you may have some viral infection causing your ear pain.  I would recommend using Tylenol and Motrin to help with the pain.  Can also try lidocaine spray for your throat.  If symptoms do not improve over the next couple of days or continue to worsen, please follow-up with your primary care provider.  I hope you feel better soon. Return if you develop sudden loss of hearing or fever that cannot be managed with Tylenol Motrin.

## 2021-12-03 NOTE — ED Notes (Signed)
D/c paperwork reviewed with pt. No questions or concerns at time of d/c. Pt ambulatory to ED exit, NAD 

## 2021-12-03 NOTE — ED Provider Notes (Signed)
[facility] EMERGENCY DEPARTMENT Provider Note   CSN: [id number] Arrival date & time: 12/03/21  1618     History Chief Complaint  Patient presents with   Otalgia    [name] is a 36 y.o. female who presents to the ED for evaluation of right ear pain that started 3 days ago and is worsening since onset.  Patient states the pain is aching and now is radiating down towards the right side of her throat.  She denies other signs of illness including fever, cough, congestion, shortness of breath, nausea/vomiting/diarrhea.  She has not been submerged in any water recently.  No known trauma.  Hearing is intact.  She has no other complaints.  She notes that she tried to get into her primary care doctor but they do not have any appointments available until February which is why she came here.   Otalgia Associated symptoms: sore throat   Associated symptoms: no abdominal pain, no fever, no headaches, no rash and no vomiting       Home Medications Prior to Admission medications   Medication Sig Start Date End Date Taking? Authorizing Provider  ibuprofen (ADVIL,MOTRIN) 600 MG tablet Take 1 tablet (600 mg total) by mouth every 6 (six) hours as needed for pain. 08/18/13   Tracey Harries, MD  ibuprofen (ADVIL,MOTRIN) 800 MG tablet Take 1 tablet (800 mg total) by mouth 3 (three) times daily. 08/28/14   Arby Barrette, MD  neomycin-bacitracin-polymyxin (NEOSPORIN) ointment Apply 1 application topically every 12 (twelve) hours. 04/26/18   Mesner, Barbara Cower, MD  ondansetron (ZOFRAN) 4 MG tablet Take 1 tablet (4 mg total) by mouth every 6 (six) hours. 02/11/18   Gwyneth Sprout, MD  Prenatal Vit-Fe Fumarate-FA (PRENATAL MULTIVITAMIN) TABS Take 1 tablet by mouth daily at 12 noon.    [provider]      Allergies    Patient has no known allergies.    Review of Systems   Review of Systems  Constitutional:  Negative for fever.  HENT:  Positive for ear pain and sore throat.    Eyes: Negative.   Respiratory:  Negative for shortness of breath.   Cardiovascular: Negative.   Gastrointestinal:  Negative for abdominal pain and vomiting.  Endocrine: Negative.   Genitourinary: Negative.   Musculoskeletal: Negative.   Skin:  Negative for rash.  Neurological:  Negative for headaches.  All other systems reviewed and are negative.  Physical Exam Updated Vital Signs BP (!) 123/91 (BP Location: Right Arm)    Pulse 80    Temp 99.5 F (37.5 C) (Oral)    Resp 18    Ht 5' (1.524 m)    Wt 72.6 kg    LMP 11/18/2021    SpO2 100%    BMI 31.25 kg/m  Physical Exam Vitals and nursing note reviewed.  Constitutional:      General: She is not in acute distress.    Appearance: She is not ill-appearing.  HENT:     Head: Atraumatic.     Right Ear: Tympanic membrane, ear canal and external ear normal. There is no impacted cerumen.     Left Ear: Tympanic membrane, ear canal and external ear normal. There is no impacted cerumen.     Nose: No congestion.     Mouth/Throat:     Mouth: Mucous membranes are moist.     Pharynx: No oropharyngeal exudate or posterior oropharyngeal erythema.  Eyes:     Conjunctiva/sclera: Conjunctivae normal.  Cardiovascular:     Rate and  Rhythm: Normal rate and regular rhythm.     Pulses: Normal pulses.     Heart sounds: No murmur heard. Pulmonary:     Effort: Pulmonary effort is normal. No respiratory distress.     Breath sounds: Normal breath sounds.  Abdominal:     General: Abdomen is flat. There is no distension.     Palpations: Abdomen is soft.     Tenderness: There is no abdominal tenderness.  Musculoskeletal:        General: Normal range of motion.     Cervical back: Normal range of motion.  Skin:    General: Skin is warm and dry.     Capillary Refill: Capillary refill takes less than 2 seconds.  Neurological:     General: No focal deficit present.     Mental Status: She is alert.  Psychiatric:        Mood and Affect: Mood normal.     ED Results / Procedures / Treatments   Labs (all labs ordered are listed, but only abnormal results are displayed) Labs Reviewed - No data to display  EKG None  Radiology No results found.  Procedures Procedures   Medications Ordered in ED Medications - No data to display  ED Course/ Medical Decision Making/ A&P                           Medical Decision Making  History:  [name] is a 36 y.o. female who presents to the ED for evaluation of right ear pain that started 3 days ago and is worsening since onset.  Patient states the pain is aching and now is radiating down towards the right side of her throat.  She denies other signs of illness including fever, cough, congestion, shortness of breath, nausea/vomiting/diarrhea.  She has not been submerged in any water recently.  No known trauma.  Hearing is intact.  She has no other complaints.  She notes that she tried to get into her primary care doctor but they do not have any appointments available until February which is why she came here. This patient presents to the ED for concern of otalgia, this involves an extensive number of treatment options, and is a complaint that carries with it a high risk of complications and morbidity.  AOM, EOM, URI, mastitis, peritonsillar abscess  Initial impression:  Patient resting comfortably on bed.  Ear and throat exam are normal.  Negative mastoid tenderness, negative tragus test.  Vitals are reassuring.  No other imaging or work-up needed.   Disposition:  After consideration of the diagnostic results, physical exam, history and the patients response to treatment feel that the patent would benefit from discharge with outpatient follow-up.   Right-sided otalgia: There is no evidence of infection of her throat or of her ear.  There is no fluid buildup behind the tympanic membrane likely causing her symptoms.  This could be from a viral etiology.  Advised her to use Motrin and  Tylenol as needed to help with her pain.  If pain does not improve over the next few days, she should follow-up with her primary care doctor to assess need for antibiotics.  Patient understands amenable to plan.  Discharged home in good condition.   Final Clinical Impression(s) / ED Diagnoses Final diagnoses:  Right ear pain    Rx / DC Orders ED Discharge Orders     None         Raynald Blend  R, PA-C 12/03/21 1712    Benjiman CorePickering, Nathan, MD 12/03/21 2348

## 2022-01-14 ENCOUNTER — Other Ambulatory Visit: Payer: Self-pay

## 2022-01-14 ENCOUNTER — Encounter (HOSPITAL_BASED_OUTPATIENT_CLINIC_OR_DEPARTMENT_OTHER): Payer: Self-pay

## 2022-01-14 ENCOUNTER — Emergency Department (HOSPITAL_BASED_OUTPATIENT_CLINIC_OR_DEPARTMENT_OTHER)
Admission: EM | Admit: 2022-01-14 | Discharge: 2022-01-14 | Disposition: A | Discharge status: Home / Self Care | Payer: Medicaid Other | Attending: Emergency Medicine | Admitting: Emergency Medicine

## 2022-01-14 DIAGNOSIS — B349 Viral infection, unspecified: Secondary | ICD-10-CM

## 2022-01-14 DIAGNOSIS — R509 Fever, unspecified: Secondary | ICD-10-CM

## 2022-01-14 DIAGNOSIS — U071 COVID-19: Secondary | ICD-10-CM | POA: Insufficient documentation

## 2022-01-14 LAB — GROUP A STREP BY PCR: Group A Strep by PCR: NOT DETECTED

## 2022-01-14 LAB — RESP PANEL BY RT-PCR (FLU A&B, COVID) ARPGX2
Influenza A by PCR: NEGATIVE
Influenza B by PCR: NEGATIVE
SARS Coronavirus 2 by RT PCR: POSITIVE — AB

## 2022-01-14 MED ORDER — DEXAMETHASONE 4 MG PO TABS
10.0000 mg | ORAL_TABLET | Freq: Once | ORAL | Status: AC
  Administered 2022-01-14: 10 mg via ORAL
  Filled 2022-01-14: qty 3

## 2022-01-14 NOTE — ED Provider Notes (Signed)
[facility] EMERGENCY DEPARTMENT Provider Note   CSN: [id number] Arrival date & time: 01/14/22  0803     History  Chief Complaint  Patient presents with   Fever    [name] is a 36 y.o. female.  The history is provided by the patient.  Fever Temp source:  Subjective Severity:  Mild Onset quality:  Gradual Duration:  2 days Timing:  Intermittent Progression:  Waxing and waning Chronicity:  New Relieved by:  Acetaminophen Worsened by:  Nothing Associated symptoms: chills, congestion and sore throat   Associated symptoms: no chest pain, no confusion, no cough, no diarrhea, no dysuria, no ear pain, no headaches, no myalgias, no rash, no rhinorrhea, no somnolence and no vomiting       Home Medications Prior to Admission medications   Medication Sig Start Date End Date Taking? Authorizing Provider  ibuprofen (ADVIL,MOTRIN) 600 MG tablet Take 1 tablet (600 mg total) by mouth every 6 (six) hours as needed for pain. 08/18/13   Tracey Harries, MD  ibuprofen (ADVIL,MOTRIN) 800 MG tablet Take 1 tablet (800 mg total) by mouth 3 (three) times daily. 08/28/14   Arby Barrette, MD  neomycin-bacitracin-polymyxin (NEOSPORIN) ointment Apply 1 application topically every 12 (twelve) hours. 04/26/18   Mesner, Barbara Cower, MD  ondansetron (ZOFRAN) 4 MG tablet Take 1 tablet (4 mg total) by mouth every 6 (six) hours. 02/11/18   Gwyneth Sprout, MD  Prenatal Vit-Fe Fumarate-FA (PRENATAL MULTIVITAMIN) TABS Take 1 tablet by mouth daily at 12 noon.    [provider]      Allergies    Patient has no known allergies.    Review of Systems   Review of Systems  Constitutional:  Positive for chills and fever.  HENT:  Positive for congestion and sore throat. Negative for ear pain and rhinorrhea.   Respiratory:  Negative for cough.   Cardiovascular:  Negative for chest pain.  Gastrointestinal:  Negative for diarrhea and vomiting.  Genitourinary:  Negative for dysuria.   Musculoskeletal:  Negative for myalgias.  Skin:  Negative for rash.  Neurological:  Negative for headaches.  Psychiatric/Behavioral:  Negative for confusion.    Physical Exam Updated Vital Signs BP (!) 122/94 (BP Location: Right Arm)    Pulse 98    Temp 99.5 F (37.5 C) (Oral)    Resp 18    Ht 5' (1.524 m)    Wt 71.7 kg    SpO2 98%    BMI 30.86 kg/m  Physical Exam Vitals and nursing note reviewed.  Constitutional:      General: She is not in acute distress.    Appearance: She is well-developed. She is not ill-appearing.  HENT:     Head: Normocephalic and atraumatic.     Nose: Congestion present.     Mouth/Throat:     Mouth: Mucous membranes are moist.     Pharynx: Posterior oropharyngeal erythema present. No oropharyngeal exudate.  Eyes:     Extraocular Movements: Extraocular movements intact.     Conjunctiva/sclera: Conjunctivae normal.     Pupils: Pupils are equal, round, and reactive to light.  Cardiovascular:     Rate and Rhythm: Normal rate and regular rhythm.     Pulses: Normal pulses.     Heart sounds: No murmur heard. Pulmonary:     Effort: Pulmonary effort is normal. No respiratory distress.     Breath sounds: Normal breath sounds.  Abdominal:     Palpations: Abdomen is soft.     Tenderness: There is  no abdominal tenderness.  Musculoskeletal:        General: No swelling.     Cervical back: Neck supple.  Skin:    General: Skin is warm and dry.     Capillary Refill: Capillary refill takes less than 2 seconds.  Neurological:     Mental Status: She is alert.  Psychiatric:        Mood and Affect: Mood normal.    ED Results / Procedures / Treatments   Labs (all labs ordered are listed, but only abnormal results are displayed) Labs Reviewed  RESP PANEL BY RT-PCR (FLU A&B, COVID) ARPGX2  GROUP A STREP BY PCR    EKG None  Radiology No results found.  Procedures Procedures    Medications Ordered in ED Medications  dexamethasone (DECADRON) tablet 10  mg (10 mg Oral Given 01/14/22 1115)    ED Course/ Medical Decision Making/ A&P                           Medical Decision Making Risk Prescription drug management.   [name] is here with fever, viral symptoms.  Sore throat.  Will check for flu, COVID, strep which is likely the differential.  Probably some other virus as well.  No respiratory symptoms.  No abdominal pain, nausea, vomiting.  No cough or sputum production.  No concern for pneumonia or UTI.  No concern that she is pregnant.  We will give a dose of Decadron to help with sore throat.  We will call in antibiotic if strep test is positive.  Overall she appears very well.  Discharged in good condition.  This chart was dictated using voice recognition software.  Despite best efforts to proofread,  errors can occur which can change the documentation meaning.         Final Clinical Impression(s) / ED Diagnoses Final diagnoses:  Viral illness  Fever in adult    Rx / DC Orders ED Discharge Orders     None         Virgina Norfolk, DO 01/14/22 5208

## 2022-01-14 NOTE — Discharge Instructions (Signed)
Follow-up COVID and strep testing and MyChart.  If your strep test is positive I will send an antibiotic to your pharmacy.  You have been treated with a long-acting steroid.  Continue Tylenol and ibuprofen for pain and fever.

## 2022-01-14 NOTE — ED Triage Notes (Signed)
Fever that started on Sunday, with chills and body aches. Taking Nyquil and Tylenol.
